# Patient Record
Sex: Male | Born: 1965 | Race: Black or African American | Hispanic: No | Marital: Married | Smoking: Current every day smoker
Health system: Southern US, Community
[De-identification: ages and names within clinical notes are randomized; demographics above are authoritative.]

## PROBLEM LIST (undated history)

## (undated) ENCOUNTER — Emergency Department (HOSPITAL_COMMUNITY): Admission: EM | Payer: BC Managed Care – PPO

## (undated) DIAGNOSIS — I10 Essential (primary) hypertension: Secondary | ICD-10-CM

## (undated) DIAGNOSIS — E7800 Pure hypercholesterolemia, unspecified: Secondary | ICD-10-CM

## (undated) DIAGNOSIS — F1410 Cocaine abuse, uncomplicated: Secondary | ICD-10-CM

## (undated) DIAGNOSIS — M109 Gout, unspecified: Secondary | ICD-10-CM

## (undated) HISTORY — PX: KNEE SURGERY: SHX244

---

## 2004-10-06 ENCOUNTER — Emergency Department (HOSPITAL_COMMUNITY): Admission: EM | Admit: 2004-10-06 | Discharge: 2004-10-06 | Payer: Self-pay | Admitting: Emergency Medicine

## 2010-01-17 ENCOUNTER — Ambulatory Visit: Payer: Self-pay | Admitting: Diagnostic Radiology

## 2010-01-17 ENCOUNTER — Emergency Department (HOSPITAL_BASED_OUTPATIENT_CLINIC_OR_DEPARTMENT_OTHER): Admission: EM | Admit: 2010-01-17 | Discharge: 2010-01-17 | Payer: Self-pay | Admitting: Emergency Medicine

## 2010-05-20 LAB — COMPREHENSIVE METABOLIC PANEL
ALT: 41 U/L (ref 0–53)
Alkaline Phosphatase: 64 U/L (ref 39–117)
Chloride: 108 mEq/L (ref 96–112)
Glucose, Bld: 93 mg/dL (ref 70–99)
Potassium: 4.3 mEq/L (ref 3.5–5.1)
Sodium: 143 mEq/L (ref 135–145)
Total Bilirubin: 0.7 mg/dL (ref 0.3–1.2)
Total Protein: 7.5 g/dL (ref 6.0–8.3)

## 2010-05-20 LAB — CBC
HCT: 44.9 % (ref 39.0–52.0)
Hemoglobin: 15.4 g/dL (ref 13.0–17.0)
RBC: 5.18 MIL/uL (ref 4.22–5.81)
RDW: 12.2 % (ref 11.5–15.5)
WBC: 7.9 10*3/uL (ref 4.0–10.5)

## 2010-05-20 LAB — URINALYSIS, ROUTINE W REFLEX MICROSCOPIC
Glucose, UA: NEGATIVE mg/dL
Hgb urine dipstick: NEGATIVE
Protein, ur: NEGATIVE mg/dL
Specific Gravity, Urine: 1.012 (ref 1.005–1.030)
pH: 7 (ref 5.0–8.0)

## 2010-05-20 LAB — DIFFERENTIAL
Basophils Relative: 0 % (ref 0–1)
Eosinophils Absolute: 0.1 10*3/uL (ref 0.0–0.7)
Monocytes Absolute: 0.7 10*3/uL (ref 0.1–1.0)
Monocytes Relative: 8 % (ref 3–12)
Neutrophils Relative %: 67 % (ref 43–77)

## 2011-02-21 ENCOUNTER — Emergency Department (INDEPENDENT_AMBULATORY_CARE_PROVIDER_SITE_OTHER)
Admission: EM | Admit: 2011-02-21 | Discharge: 2011-02-21 | Disposition: A | Discharge status: Home / Self Care | Payer: Self-pay | Source: Home / Self Care | Attending: Family Medicine | Admitting: Family Medicine

## 2011-02-21 ENCOUNTER — Encounter: Payer: Self-pay | Admitting: *Deleted

## 2011-02-21 DIAGNOSIS — N342 Other urethritis: Secondary | ICD-10-CM

## 2011-02-21 HISTORY — DX: Essential (primary) hypertension: I10

## 2011-02-21 LAB — POCT URINALYSIS DIP (DEVICE)
Hgb urine dipstick: NEGATIVE
Nitrite: NEGATIVE
Urobilinogen, UA: 1 mg/dL (ref 0.0–1.0)
pH: 7 (ref 5.0–8.0)

## 2011-02-21 MED ORDER — DOXYCYCLINE HYCLATE 100 MG PO CAPS: 100.0000 mg | ORAL_CAPSULE | Freq: Two times a day (BID) | ORAL | Status: AC

## 2011-02-21 NOTE — ED Provider Notes (Signed)
History     CSN: [id number] Arrival date & time: 02/21/2011 10:53 AM   First MD Initiated Contact with Patient 02/21/11 0932      Chief Complaint  Patient presents with  . Dysuria  . Urinary Frequency    (Consider location/radiation/quality/duration/timing/severity/associated sxs/prior treatment) Patient is a 45 y.o. male presenting with dysuria and frequency. The history is provided by the patient.  Dysuria  This is a new problem. The current episode started more than 1 week ago (onset after sexual encounter, no d/c or skin lesions.). The problem occurs every urination. The problem has not changed since onset.The quality of the pain is described as burning. The pain is mild. There has been no fever. Associated symptoms include frequency. Pertinent negatives include no urgency and no flank pain. He has tried nothing for the symptoms.  Urinary Frequency    Past Medical History  Diagnosis Date  . Hypertension     Past Surgical History  Procedure Date  . Knee surgery     Family History  Problem Relation Age of Onset  . Hypertension Mother   . Hypertension Father   . Hypertension Sister   . Hypertension Brother     History  Substance Use Topics  . Smoking status: Current Everyday Smoker  . Smokeless tobacco: Not on file  . Alcohol Use: No      Review of Systems  Constitutional: Negative.   Genitourinary: Positive for dysuria and frequency. Negative for urgency, flank pain, discharge, penile swelling, scrotal swelling, penile pain and testicular pain.    Allergies  Review of patient's allergies indicates no known allergies.  Home Medications   Current Outpatient Rx  Name Route Sig Dispense Refill  . HYDROCHLOROTHIAZIDE 25 MG PO TABS Oral Take 25 mg by mouth daily.      Marland Kitchen DOXYCYCLINE HYCLATE 100 MG PO CAPS Oral Take 1 capsule (100 mg total) by mouth 2 (two) times daily. 20 capsule 0    BP 151/86  Pulse 82  Temp(Src) 98.1 F (36.7 C) (Oral)  Resp 16   SpO2 100%  Physical Exam  Nursing note and vitals reviewed. Constitutional: He appears well-developed and well-nourished.  Abdominal: Soft. Bowel sounds are normal. Hernia confirmed negative in the right inguinal area and confirmed negative in the left inguinal area.  Genitourinary: Testes normal and penis normal. Circumcised. No penile tenderness.    ED Course  Procedures (including critical care time)   Labs Reviewed  POCT URINALYSIS DIP (DEVICE)  POCT URINALYSIS DIPSTICK  GC/CHLAMYDIA PROBE AMP, GENITAL   No results found.   1. Urethritis       MDM  U/a neg        Barkley Bruns, MD 02/21/11 1115

## 2011-02-21 NOTE — ED Notes (Signed)
Pt with c/o urinary frequency and dysuria denies penile discharge

## 2011-02-23 NOTE — ED Notes (Signed)
Pt. came in for his lab results. I verified DOB and checked chart. I  told him they were still " in process." I gave him my number and told him to call back tomorrow after 1130 and I would check again for him. Vassie Moselle 02/23/2011

## 2011-02-24 ENCOUNTER — Telehealth (HOSPITAL_COMMUNITY): Payer: Self-pay | Admitting: *Deleted

## 2011-02-26 ENCOUNTER — Telehealth (HOSPITAL_COMMUNITY): Payer: Self-pay | Admitting: *Deleted

## 2011-03-24 NOTE — ED Notes (Signed)
Brandy Sessoms from the [facility] called and requested pt.'s STD result from 12/15. I told her the GC/Chlamydia were neg. She asked me to fax them to her for the RN at the clinic. Labs faxed and confirmation received. Vassie Moselle 03/24/2011

## 2011-03-28 ENCOUNTER — Emergency Department (HOSPITAL_COMMUNITY)
Admission: EM | Admit: 2011-03-28 | Discharge: 2011-03-28 | Disposition: A | Discharge status: Home / Self Care | Payer: Self-pay | Attending: Emergency Medicine | Admitting: Emergency Medicine

## 2011-03-28 ENCOUNTER — Encounter (HOSPITAL_COMMUNITY): Payer: Self-pay | Admitting: Emergency Medicine

## 2011-03-28 DIAGNOSIS — I10 Essential (primary) hypertension: Secondary | ICD-10-CM | POA: Insufficient documentation

## 2011-03-28 DIAGNOSIS — N342 Other urethritis: Secondary | ICD-10-CM | POA: Insufficient documentation

## 2011-03-28 DIAGNOSIS — R011 Cardiac murmur, unspecified: Secondary | ICD-10-CM | POA: Insufficient documentation

## 2011-03-28 DIAGNOSIS — R350 Frequency of micturition: Secondary | ICD-10-CM | POA: Insufficient documentation

## 2011-03-28 DIAGNOSIS — R110 Nausea: Secondary | ICD-10-CM | POA: Insufficient documentation

## 2011-03-28 DIAGNOSIS — N509 Disorder of male genital organs, unspecified: Secondary | ICD-10-CM | POA: Insufficient documentation

## 2011-03-28 DIAGNOSIS — Z79899 Other long term (current) drug therapy: Secondary | ICD-10-CM | POA: Insufficient documentation

## 2011-03-28 LAB — URINALYSIS, ROUTINE W REFLEX MICROSCOPIC
Bilirubin Urine: NEGATIVE
Glucose, UA: NEGATIVE mg/dL
Ketones, ur: NEGATIVE mg/dL
pH: 6 (ref 5.0–8.0)

## 2011-03-28 LAB — URINE MICROSCOPIC-ADD ON

## 2011-03-28 MED ORDER — HYDROCHLOROTHIAZIDE 25 MG PO TABS: 25.0000 mg | ORAL_TABLET | Freq: Every day | ORAL | Status: DC

## 2011-03-28 MED ORDER — CIPROFLOXACIN HCL 500 MG PO TABS: 500.0000 mg | ORAL_TABLET | Freq: Two times a day (BID) | ORAL | Status: AC

## 2011-03-28 NOTE — ED Notes (Signed)
Feels like "something is stuck in penis-like urine is still dribbling" has been treated and tested for STDs at Urgent Care

## 2011-03-28 NOTE — ED Provider Notes (Signed)
History     CSN: [id number]  Arrival date & time 03/28/11  0820   First MD Initiated Contact with Patient 03/28/11 [phone]      Chief Complaint  Patient presents with  . Penis Pain    (Consider location/radiation/quality/duration/timing/severity/associated sxs/prior treatment) HPI  Patient relates he has a new sexual partner. He relates they were using topical chills. He states about a month ago he felt like he had a "pill in the head of my penis" and was having frequency of urination. He was seen at the urgent care and had negative STD tests. He relates he was treated with doxycycline and got better, however 2 weeks ago his symptoms returned. He states [facility] and had repeat STD tests done which were negative. He denies dysuria, he does have frequency without nocturia. He denies a drip and states sometimes he has urine leaking out after he urinates. He denies abdominal pain or fever. He has nausea without vomiting. He states he's never had this before. He states he has stopped using the topical ointments.  PCP none  Past Medical History  Diagnosis Date  . Hypertension     Past Surgical History  Procedure Date  . Knee surgery     Family History  Problem Relation Age of Onset  . Hypertension Mother   . Hypertension Father   . Hypertension Sister   . Hypertension Brother     History  Substance Use Topics  . Smoking status: Current Everyday Smoker  . Smokeless tobacco: Not on file  . 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    Allergies  Aspirin  Home Medications   Current Outpatient Rx  Name Route Sig Dispense Refill  . HYDROCHLOROTHIAZIDE 25 MG PO TABS Oral Take 25 mg by mouth daily.      Pt states he has 20 pills left  BP 147/76  Pulse 78  Resp 16  SpO2 98% Vital signs normal    Physical Exam  Nursing note  and vitals reviewed. Constitutional: He is oriented to person, place, and time. He appears well-developed and well-nourished.  Non-toxic appearance. He does not appear ill. No distress.  HENT:  Head: Normocephalic and atraumatic.  Right Ear: External ear normal.  Left Ear: External ear normal.  Nose: Nose normal. No mucosal edema or rhinorrhea.  Mouth/Throat: Mucous membranes are normal. No dental abscesses or uvula swelling.  Eyes: Conjunctivae and EOM are normal. Pupils are equal, round, and reactive to light.  Neck: Normal range of motion and full passive range of motion without pain.  Cardiovascular:  Murmur heard. Pulmonary/Chest: Effort normal. No respiratory distress. He has no rhonchi. He exhibits no crepitus.  Abdominal: Soft. Normal appearance and bowel sounds are normal. He exhibits no distension. There is no tenderness. There is no rebound and no guarding.  Genitourinary:       Patient has normal external genitalia. Testicles are normal size without masses and are nontender. His epididymis is nontender bilaterally. He is uncircumcised and he has no penile drainage during my exam. There is no abnormality to the skin on the head of his penis such as balanitis.  Musculoskeletal: Normal range of motion. He exhibits no edema and no tenderness.       Moves all extremities well.   Neurological: He is alert and oriented to person, place,  and time. He has normal strength. No cranial nerve deficit.  Skin: Skin is warm, dry and intact. No rash noted. No erythema. No pallor.  Psychiatric: He has a normal mood and affect. His speech is normal and behavior is normal. His mood appears not anxious.    ED Course  Procedures (including critical care time)  Results for orders placed during the hospital encounter of 03/28/11  URINALYSIS, ROUTINE W REFLEX MICROSCOPIC      Component Value Range   Color, Urine YELLOW  YELLOW    APPearance CLEAR  CLEAR    Specific Gravity, Urine 1.025  1.005 -  1.030    pH 6.0  5.0 - 8.0    Glucose, UA NEGATIVE  NEGATIVE (mg/dL)   Hgb urine dipstick MODERATE (*) NEGATIVE    Bilirubin Urine NEGATIVE  NEGATIVE    Ketones, ur NEGATIVE  NEGATIVE (mg/dL)   Protein, ur NEGATIVE  NEGATIVE (mg/dL)   Urobilinogen, UA 1.0  0.0 - 1.0 (mg/dL)   Nitrite NEGATIVE  NEGATIVE    Leukocytes, UA NEGATIVE  NEGATIVE   URINE MICROSCOPIC-ADD ON      Component Value Range   Squamous Epithelial / LPF RARE  RARE    RBC / HPF 0-2  <3 (RBC/hpf)   Urine-Other MUCOUS PRESENT     Laboratory interpretation all normal except microscopic hematuria    Diagnoses that have been ruled out:  None  Diagnoses that are still under consideration:  None  Final diagnoses:  Urethritis    New Prescriptions   CIPROFLOXACIN (CIPRO) 500 MG TABLET    Take 1 tablet (500 mg total) by mouth 2 (two) times daily.   HYDROCHLOROTHIAZIDE (HYDRODIURIL) 25 MG TABLET    Take 1 tablet (25 mg total) by mouth daily.   Plan discharge  Devoria Albe, MD, FACEP     MDM          Ward Givens, MD 03/28/11 1018

## 2011-06-12 DIAGNOSIS — F1990 Other psychoactive substance use, unspecified, uncomplicated: Secondary | ICD-10-CM | POA: Insufficient documentation

## 2011-06-12 DIAGNOSIS — Z720 Tobacco use: Secondary | ICD-10-CM | POA: Insufficient documentation

## 2011-06-12 DIAGNOSIS — I10 Essential (primary) hypertension: Secondary | ICD-10-CM | POA: Insufficient documentation

## 2014-11-09 ENCOUNTER — Emergency Department (HOSPITAL_BASED_OUTPATIENT_CLINIC_OR_DEPARTMENT_OTHER): Payer: 59

## 2014-11-09 ENCOUNTER — Encounter (HOSPITAL_BASED_OUTPATIENT_CLINIC_OR_DEPARTMENT_OTHER): Payer: Self-pay | Admitting: *Deleted

## 2014-11-09 ENCOUNTER — Emergency Department (HOSPITAL_BASED_OUTPATIENT_CLINIC_OR_DEPARTMENT_OTHER)
Admission: EM | Admit: 2014-11-09 | Discharge: 2014-11-09 | Disposition: A | Discharge status: Home / Self Care | Payer: 59 | Attending: Emergency Medicine | Admitting: Emergency Medicine

## 2014-11-09 DIAGNOSIS — Z720 Tobacco use: Secondary | ICD-10-CM | POA: Insufficient documentation

## 2014-11-09 DIAGNOSIS — I10 Essential (primary) hypertension: Secondary | ICD-10-CM | POA: Insufficient documentation

## 2014-11-09 DIAGNOSIS — M79662 Pain in left lower leg: Secondary | ICD-10-CM | POA: Diagnosis not present

## 2014-11-09 DIAGNOSIS — Z79899 Other long term (current) drug therapy: Secondary | ICD-10-CM | POA: Insufficient documentation

## 2014-11-09 DIAGNOSIS — R079 Chest pain, unspecified: Secondary | ICD-10-CM | POA: Insufficient documentation

## 2014-11-09 DIAGNOSIS — M79605 Pain in left leg: Secondary | ICD-10-CM

## 2014-11-09 LAB — CBC
HCT: 42.3 % (ref 39.0–52.0)
Hemoglobin: 14.1 g/dL (ref 13.0–17.0)
MCH: 28.7 pg (ref 26.0–34.0)
MCHC: 33.3 g/dL (ref 30.0–36.0)
MCV: 86.2 fL (ref 78.0–100.0)
PLATELETS: 146 10*3/uL — AB (ref 150–400)
RBC: 4.91 MIL/uL (ref 4.22–5.81)
RDW: 13.5 % (ref 11.5–15.5)
WBC: 6.7 10*3/uL (ref 4.0–10.5)

## 2014-11-09 LAB — BASIC METABOLIC PANEL
Anion gap: 7 (ref 5–15)
BUN: 13 mg/dL (ref 6–20)
CHLORIDE: 108 mmol/L (ref 101–111)
CO2: 25 mmol/L (ref 22–32)
CREATININE: 1.19 mg/dL (ref 0.61–1.24)
Calcium: 8.8 mg/dL — ABNORMAL LOW (ref 8.9–10.3)
GFR calc Af Amer: >60 mL/min (ref >60)
GLUCOSE: 89 mg/dL (ref 65–99)
POTASSIUM: 4 mmol/L (ref 3.5–5.1)
SODIUM: 140 mmol/L (ref 135–145)

## 2014-11-09 LAB — TROPONIN I: Troponin I: <0.03 ng/mL (ref <0.031)

## 2014-11-09 MED ORDER — GI COCKTAIL ~~LOC~~
30.0000 mL | Freq: Once | ORAL | Status: AC
  Administered 2014-11-09: 30 mL via ORAL
  Filled 2014-11-09: qty 30

## 2014-11-09 MED ORDER — IBUPROFEN 800 MG PO TABS
800.0000 mg | ORAL_TABLET | Freq: Once | ORAL | Status: AC
  Administered 2014-11-09: 800 mg via ORAL
  Filled 2014-11-09: qty 1

## 2014-11-09 MED ORDER — IBUPROFEN 800 MG PO TABS: 800.0000 mg | ORAL_TABLET | Freq: Three times a day (TID) | ORAL | Status: DC | PRN

## 2014-11-09 NOTE — ED Notes (Signed)
Per pt report was working in [employer] at [employer] having lt calf pain for two weeks. Tried ice, heat and ibuprofen with no change. Used ace wrap to support lt calf. Went to urgent care and was told to come to ED since he was having calf pain and chest pain. Came by EMS, report was received from paramedic.

## 2014-11-09 NOTE — ED Provider Notes (Signed)
CSN: [id number]     Arrival date & time 11/09/14  1849 History  This chart was scribe for Elwin Mocha, MD by Angelene Giovanni, ED Scribe. The patient was seen in room MH09/MH09 and the patient's care was started at 7:13 PM.    Chief Complaint  Patient presents with  . Chest Pain  . Leg Pain   Patient is a 49 y.o. male presenting with chest pain and leg pain. The history is provided by the patient. No language interpreter was used.  Chest Pain Pain location:  Unable to specify Pain radiates to:  Does not radiate Pain radiates to the back: no   Pain severity:  Moderate Onset quality:  Sudden Timing:  Constant Progression:  Unchanged Chronicity:  New Relieved by:  None tried Worsened by:  Nothing tried Ineffective treatments:  None tried Associated symptoms: no cough, no dizziness, no fever, no nausea and not vomiting   Risk factors: smoking   Leg Pain Location:  Leg Time since incident:  2 weeks Injury: no   Leg location:  L leg and L lower leg Pain details:    Radiates to:  Does not radiate   Severity:  Moderate   Onset quality:  Gradual   Duration:  2 weeks   Timing:  Intermittent   Progression:  Worsening Chronicity:  New Foreign body present:  No foreign bodies Relieved by:  Nothing Worsened by:  Extension, activity and bearing weight Associated symptoms: no fever    HPI Comments: BLUE WINTHER is a 49 y.o. male who presents to the Emergency Department complaining of moderate gradually worsening left calf pain after carrying windows up the stairs 2 weeks ago. He states that when he gets up in the am, it hurts when he walks on and gradually worse the more he stands up. He reports that his mother died from a DVT and was sent here to see if the calf pain is due to a DVT. He reports tobacco and cigarette use.   He also reports gradually worsening sudden CP onset today. He states that the CP feels like his GERD because he went to urgent care and was laid down on his back  flat. He denies any fever, N/V/D. He is also worried about a PE due to his family hx.   Past Medical History  Diagnosis Date  . Hypertension    Past Surgical History  Procedure Laterality Date  . Knee surgery     Family History  Problem Relation Age of Onset  . Hypertension Mother   . Hypertension Father   . Hypertension Sister   . Hypertension Brother    Social History  Substance Use Topics  . Smoking status: Current Every Day Smoker    Types: Cigarettes  . Smokeless tobacco: None  . Alcohol Use: No    Review of Systems  Constitutional: Negative for fever.  Respiratory: Negative for cough.   Cardiovascular: Positive for chest pain.  Gastrointestinal: Negative for nausea and vomiting.  Neurological: Negative for dizziness.  All other systems reviewed and are negative.     Allergies  Aspirin  Home Medications   Prior to Admission medications   Medication Sig Start Date End Date Taking? Authorizing Provider  lisinopril (PRINIVIL,ZESTRIL) 20 MG tablet Take 20 mg by mouth daily.   Yes Historical Provider, MD  omeprazole (PRILOSEC) 40 MG capsule Take 40 mg by mouth daily.   Yes Historical Provider, MD  pravastatin (PRAVACHOL) 20 MG tablet Take 20 mg by mouth daily.  Yes Historical Provider, MD   BP 159/79 mmHg  Pulse 63  Temp(Src) 98.3 F (36.8 C) (Oral)  Resp 18  Ht 5\' 11"  (1.803 m)  Wt 242 lb (109.77 kg)  BMI 33.77 kg/m2  SpO2 100% Physical Exam  Constitutional: He is oriented to person, place, and time. He appears well-developed and well-nourished. No distress.  HENT:  Head: Normocephalic and atraumatic.  Mouth/Throat: No oropharyngeal exudate.  Eyes: EOM are normal. Pupils are equal, round, and reactive to light.  Neck: Normal range of motion. Neck supple.  Cardiovascular: Normal rate and regular rhythm.  Exam reveals no friction rub.   No murmur heard. Pulmonary/Chest: Effort normal and breath sounds normal. No respiratory distress. He has no wheezes.  He has no rales.  Abdominal: He exhibits no distension. There is no tenderness. There is no rebound.  Musculoskeletal: Normal range of motion. He exhibits no edema.  Neurological: He is alert and oriented to person, place, and time.  Skin: He is not diaphoretic.  Nursing note and vitals reviewed.   ED Course  Procedures (including critical care time) DIAGNOSTIC STUDIES: Oxygen Saturation is 100% on air, normal by my interpretation.    COORDINATION OF CARE: 7:18 PM- Pt advised of plan for treatment and pt agrees.    Labs Review Labs Reviewed  BASIC METABOLIC PANEL  CBC  TROPONIN I    Imaging Review Dg Chest 2 View  11/09/2014   CLINICAL DATA:  Chest pain for 2 weeks.  EXAM: CHEST  2 VIEW  COMPARISON:  01/17/2010 and 10/06/2004  FINDINGS: The cardiomediastinal silhouette is unremarkable.  COPD/emphysema identified.  There is no evidence of focal airspace disease, pulmonary edema, suspicious pulmonary nodule/mass, pleural effusion, or pneumothorax. No acute bony abnormalities are identified.  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   Electronically Signed   By: Harmon Pier M.D.   On: 11/09/2014 19:45   US Venous Img Lower Unilateral Left  11/09/2014   CLINICAL DATA:  48 year old male with left lower extremity pain for 2 weeks.  EXAM: LEFT LOWER EXTREMITY VENOUS DOPPLER ULTRASOUND  TECHNIQUE: Gray-scale sonography with graded compression, as well as color Doppler and duplex ultrasound were performed to evaluate the lower extremity deep venous systems from the level of the common femoral vein and including the common femoral, femoral, profunda femoral, popliteal and calf veins including the posterior tibial, peroneal and gastrocnemius veins when visible. The superficial great saphenous vein was also interrogated. Spectral Doppler was utilized to evaluate flow at rest and with distal augmentation maneuvers in the common femoral, femoral and popliteal veins.   COMPARISON:  None.  FINDINGS: Deep venous system appears patent and compressible from groin through popliteal fossae.  Spontaneous venous flow present with evidence of respiratory phasicity. Augmentation intact.  No intraluminal thrombus identified.  Visualized portions of the greater saphenous veins patent.  The right common femoral vein is patent without DVT.  IMPRESSION: No evidence of left lower extremity deep venous thrombosis.   Electronically Signed   By: Harmon Pier M.D.   On: 11/09/2014 21:35   I have personally reviewed and evaluated these images and lab results as part of my medical decision-making.   EKG Interpretation   Date/Time:  Friday November 09 2014 18:55:58 EDT Ventricular Rate:  65 PR Interval:  134 QRS Duration: 90 QT Interval:  410 QTC Calculation: 426 R Axis:   -44 Text Interpretation:  Sinus rhythm with occasional Premature ventricular  complexes Left axis deviation Minimal voltage criteria for LVH,  may be  normal variant Abnormal ECG No prior for comparison Confirmed by [facility]   MD, Leiland Mihelich ([id number]) on 11/09/2014 6:57:11 PM      MDM   Final diagnoses:  Chest pain, unspecified chest pain type  Pain of left lower extremity    49 year old male here with left calf pain and chest pain. Sent from urgent care for concerns of blood clot in his leg. I think his leg pain is likely musculoskeletal. He felt it 2 weeks ago and states it's worse in the morning and then again at the end of the day and is worse after being on his feet all day. He has no swelling. He does have family history of blood clots. They were concerned also about him having chest pain. He said it started when a wait him back at the urgent care to get an EKG. He improved when he sat back up. He feels that his prior reflux. Will give him something for reflux here. We'll check labs. EKG is normal. We'll ultrasound his leg. No edema, no increase in size when compared to the other side.  [place] negative. Labs ok.  Stable for discharge.  I personally performed the services described in this documentation, which was scribed in my presence. The recorded information has been reviewed and is accurate.     Elwin Mocha, MD 11/09/14 [phone]

## 2014-11-09 NOTE — Discharge Instructions (Signed)
Chest Pain (Nonspecific) °It is often hard to give a specific diagnosis for the cause of chest pain. There is always a chance that your pain could be related to something serious, such as a heart attack or a blood clot in the lungs. You need to follow up with your health care provider for further evaluation. °CAUSES  °· Heartburn. °· Pneumonia or bronchitis. °· Anxiety or stress. °· Inflammation around your heart (pericarditis) or lung (pleuritis or pleurisy). °· A blood clot in the lung. °· A collapsed lung (pneumothorax). It can develop suddenly on its own (spontaneous pneumothorax) or from trauma to the chest. °· Shingles infection (herpes zoster virus). °The chest wall is composed of bones, muscles, and cartilage. Any of these can be the source of the pain. °· The bones can be bruised by injury. °· The muscles or cartilage can be strained by coughing or overwork. °· The cartilage can be affected by inflammation and become sore (costochondritis). °DIAGNOSIS  °Lab tests or other studies may be needed to find the cause of your pain. Your health care provider may have you take a test called an ambulatory electrocardiogram (ECG). An ECG records your heartbeat patterns over a 24-hour period. You may also have other tests, such as: °· Transthoracic echocardiogram (TTE). During echocardiography, sound waves are used to evaluate how blood flows through your heart. °· Transesophageal echocardiogram (TEE). °· Cardiac monitoring. This allows your health care provider to monitor your heart rate and rhythm in real time. °· Holter monitor. This is a portable device that records your heartbeat and can help diagnose heart arrhythmias. It allows your health care provider to track your heart activity for several days, if needed. °· Stress tests by exercise or by giving medicine that makes the heart beat faster. °TREATMENT  °· Treatment depends on what may be causing your chest pain. Treatment may include: °¨ Acid blockers for  heartburn. °¨ Anti-inflammatory medicine. °¨ Pain medicine for inflammatory conditions. °¨ Antibiotics if an infection is present. °· You may be advised to change lifestyle habits. This includes stopping smoking and avoiding alcohol, caffeine, and chocolate. °· You may be advised to keep your head raised (elevated) when sleeping. This reduces the chance of acid going backward from your stomach into your esophagus. °Most of the time, nonspecific chest pain will improve within 2-3 days with rest and mild pain medicine.  °HOME CARE INSTRUCTIONS  °· If antibiotics were prescribed, take them as directed. Finish them even if you start to feel better. °· For the next few days, avoid physical activities that bring on chest pain. Continue physical activities as directed. °· Do not use any tobacco products, including cigarettes, chewing tobacco, or electronic cigarettes. °· Avoid drinking alcohol. °· Only take medicine as directed by your health care provider. °· Follow your health care provider's suggestions for further testing if your chest pain does not go away. °· Keep any follow-up appointments you made. If you do not go to an appointment, you could develop lasting (chronic) problems with pain. If there is any problem keeping an appointment, call to reschedule. °SEEK MEDICAL CARE IF:  °· Your chest pain does not go away, even after treatment. °· You have a rash with blisters on your chest. °· You have a fever. °SEEK IMMEDIATE MEDICAL CARE IF:  °· You have increased chest pain or pain that spreads to your arm, neck, jaw, back, or abdomen. °· You have shortness of breath. °· You have an increasing cough, or you cough   up blood. °· You have severe back or abdominal pain. °· You feel nauseous or vomit. °· You have severe weakness. °· You faint. °· You have chills. °This is an emergency. Do not wait to see if the pain will go away. Get medical help at once. Call your local emergency services (911 in [place] Do not drive  yourself to the hospital. °MAKE SURE YOU:  °· Understand these instructions. °· Will watch your condition. °· Will get help right away if you are not doing well or get worse. °Document Released: 12/03/2004 Document Revised: 02/28/2013 Document Reviewed: 09/29/2007 °[employer]® Patient Information ©2015 [employer]. This information is not intended to replace advice given to you by your health care provider. Make sure you discuss any questions you have with your health care provider. °Musculoskeletal Pain °Musculoskeletal pain is muscle and boney aches and pains. These pains can occur in any part of the body. Your caregiver may treat you without knowing the cause of the pain. They may treat you if blood or urine tests, X-rays, and other tests were normal.  °CAUSES °There is often not a definite cause or reason for these pains. These pains may be caused by a type of germ (virus). The discomfort may also come from overuse. Overuse includes working out too hard when your body is not fit. Boney aches also come from weather changes. Bone is sensitive to atmospheric pressure changes. °HOME CARE INSTRUCTIONS  °· Ask when your test results will be ready. Make sure you get your test results. °· Only take over-the-counter or prescription medicines for pain, discomfort, or fever as directed by your caregiver. If you were given medications for your condition, do not drive, operate machinery or power tools, or sign legal documents for 24 hours. Do not drink alcohol. Do not take sleeping pills or other medications that may interfere with treatment. °· Continue all activities unless the activities cause more pain. When the pain lessens, slowly resume normal activities. Gradually increase the intensity and duration of the activities or exercise. °· During periods of severe pain, bed rest may be helpful. Lay or sit in any position that is comfortable. °· Putting ice on the injured area. °¨ Put ice in a bag. °¨ Place a towel between  your skin and the bag. °¨ Leave the ice on for 15 to 20 minutes, 3 to 4 times a day. °· Follow up with your caregiver for continued problems and no reason can be found for the pain. If the pain becomes worse or does not go away, it may be necessary to repeat tests or do additional testing. Your caregiver may need to look further for a possible cause. °SEEK IMMEDIATE MEDICAL CARE IF: °· You have pain that is getting worse and is not relieved by medications. °· You develop chest pain that is associated with shortness or breath, sweating, feeling sick to your stomach (nauseous), or throw up (vomit). °· Your pain becomes localized to the abdomen. °· You develop any new symptoms that seem different or that concern you. °MAKE SURE YOU:  °· Understand these instructions. °· Will watch your condition. °· Will get help right away if you are not doing well or get worse. °Document Released: 02/23/2005 Document Revised: 05/18/2011 Document Reviewed: 10/28/2012 °ExitCare® Patient Information ©2015 [employer]. This information is not intended to replace advice given to you by your health care provider. Make sure you discuss any questions you have with your health care provider. ° °

## 2015-07-05 DIAGNOSIS — R7303 Prediabetes: Secondary | ICD-10-CM | POA: Insufficient documentation

## 2015-10-09 ENCOUNTER — Encounter (HOSPITAL_BASED_OUTPATIENT_CLINIC_OR_DEPARTMENT_OTHER): Payer: Self-pay

## 2015-10-09 ENCOUNTER — Emergency Department (HOSPITAL_BASED_OUTPATIENT_CLINIC_OR_DEPARTMENT_OTHER): Payer: 59

## 2015-10-09 ENCOUNTER — Emergency Department (HOSPITAL_BASED_OUTPATIENT_CLINIC_OR_DEPARTMENT_OTHER)
Admission: EM | Admit: 2015-10-09 | Discharge: 2015-10-09 | Disposition: A | Discharge status: Home / Self Care | Payer: 59 | Attending: Emergency Medicine | Admitting: Emergency Medicine

## 2015-10-09 DIAGNOSIS — R0602 Shortness of breath: Secondary | ICD-10-CM | POA: Diagnosis not present

## 2015-10-09 DIAGNOSIS — F17210 Nicotine dependence, cigarettes, uncomplicated: Secondary | ICD-10-CM | POA: Diagnosis not present

## 2015-10-09 DIAGNOSIS — R51 Headache: Secondary | ICD-10-CM | POA: Diagnosis not present

## 2015-10-09 DIAGNOSIS — R002 Palpitations: Secondary | ICD-10-CM | POA: Insufficient documentation

## 2015-10-09 DIAGNOSIS — Z79899 Other long term (current) drug therapy: Secondary | ICD-10-CM | POA: Diagnosis not present

## 2015-10-09 DIAGNOSIS — R0789 Other chest pain: Secondary | ICD-10-CM | POA: Diagnosis present

## 2015-10-09 DIAGNOSIS — I10 Essential (primary) hypertension: Secondary | ICD-10-CM | POA: Insufficient documentation

## 2015-10-09 HISTORY — DX: Pure hypercholesterolemia, unspecified: E78.00

## 2015-10-09 LAB — COMPREHENSIVE METABOLIC PANEL
ALT: 36 U/L (ref 17–63)
AST: 35 U/L (ref 15–41)
Albumin: 4.2 g/dL (ref 3.5–5.0)
Alkaline Phosphatase: 54 U/L (ref 38–126)
Anion gap: 8 (ref 5–15)
BUN: 15 mg/dL (ref 6–20)
CHLORIDE: 101 mmol/L (ref 101–111)
CO2: 28 mmol/L (ref 22–32)
Calcium: 9.4 mg/dL (ref 8.9–10.3)
Creatinine, Ser: 1.32 mg/dL — ABNORMAL HIGH (ref 0.61–1.24)
GFR calc Af Amer: >60 mL/min (ref >60)
GFR calc non Af Amer: >60 mL/min (ref >60)
GLUCOSE: 101 mg/dL — AB (ref 65–99)
POTASSIUM: 3.7 mmol/L (ref 3.5–5.1)
SODIUM: 137 mmol/L (ref 135–145)
Total Bilirubin: 0.5 mg/dL (ref 0.3–1.2)
Total Protein: 7.1 g/dL (ref 6.5–8.1)

## 2015-10-09 LAB — CBC WITH DIFFERENTIAL/PLATELET
Basophils Absolute: 0 10*3/uL (ref 0.0–0.1)
Basophils Relative: 0 %
EOS PCT: 2 %
Eosinophils Absolute: 0.2 10*3/uL (ref 0.0–0.7)
HCT: 41.6 % (ref 39.0–52.0)
Hemoglobin: 14.2 g/dL (ref 13.0–17.0)
LYMPHS ABS: 2.9 10*3/uL (ref 0.7–4.0)
LYMPHS PCT: 40 %
MCH: 29.7 pg (ref 26.0–34.0)
MCHC: 34.1 g/dL (ref 30.0–36.0)
MCV: 87 fL (ref 78.0–100.0)
MONO ABS: 0.8 10*3/uL (ref 0.1–1.0)
Monocytes Relative: 11 %
Neutro Abs: 3.4 10*3/uL (ref 1.7–7.7)
Neutrophils Relative %: 47 %
PLATELETS: 166 10*3/uL (ref 150–400)
RBC: 4.78 MIL/uL (ref 4.22–5.81)
RDW: 12.5 % (ref 11.5–15.5)
WBC: 7.2 10*3/uL (ref 4.0–10.5)

## 2015-10-09 LAB — TROPONIN I: Troponin I: <0.03 ng/mL (ref <0.03)

## 2015-10-09 NOTE — Discharge Instructions (Signed)

## 2015-10-09 NOTE — ED Notes (Signed)
EDP at [facility], c/o L CP (sharp), also sob and weakness. Onset today. Mentions yesterday heart was beating fast and hard, last for several minutes. (denies: nvd, fever, dizziness, or syncope). NSR with bigeminy on monitor, HR 65. LS CTA. Rates pain 5/10. Wife at [facility]. Pt alert, NAD, calm, interactive, resps e/u, speaking in clear complete sentences, skin W&D.

## 2015-10-09 NOTE — ED Provider Notes (Signed)
[facility] Provider Note   CSN: [id number] Arrival date & time: 10/09/15  2045  First Provider Contact:  First MD Initiated Contact with Patient 10/09/15 2127     By signing my name below, I, Freida Busman, attest that this documentation has been prepared under the direction and in the presence of Zadie Rhine, MD . Electronically Signed: Freida Busman, Scribe. 10/09/2015. 9:44 PM.  History   Chief Complaint Chief Complaint  Patient presents with  . Chest Pain   The history is provided by the patient. No language interpreter was used.  Chest Pain   The quality of the pain is described as sharp. The pain does not radiate. Associated symptoms include headaches, palpitations and shortness of breath. Pertinent negatives include no abdominal pain, no cough, no diaphoresis and no vomiting. Treatments tried: ASA. The treatment provided mild relief. Risk factors include male gender and smoking/tobacco exposure.  His past medical history is significant for hyperlipidemia and hypertension.  His family medical history is significant for PE.   HPI Comments:  Jason Black is a 50 y.o. male with a history of HTN and HLD who presents to the Emergency Department complaining of sharp CP which began yesterday while he was shaving. He reports associated palpitations and SOB. Pt is a smoker. He states his pain was exacerbated while smoking. He also notes HA x 1 week. He denies LOC, cough, abdominal pain, diaphoresis, and vomiting. He also denies h/o MI CVA and blood clots. Pt admits to daily caffeine intake ~ 2 cups of coffee or tea a day. He denies alcohol consumption and illicit drug use. He has taken 81 mg ASA PTA with mild relief.   Past Medical History:  Diagnosis Date  . High cholesterol   . Hypertension     There are no active problems to display for this patient.   Past Surgical History:  Procedure Laterality Date  . KNEE SURGERY      Home Medications    Prior to  Admission medications   Medication Sig Start Date End Date Taking? Authorizing Provider  amLODipine (NORVASC) 10 MG tablet Take 10 mg by mouth daily.   Yes Historical Provider, MD  hydrochlorothiazide (HYDRODIURIL) 12.5 MG tablet Take 25 mg by mouth daily.   Yes Historical Provider, MD  multivitamin-iron-minerals-folic acid (CENTRUM) chewable tablet Chew 1 tablet by mouth daily.   Yes Historical Provider, MD  ibuprofen (ADVIL,MOTRIN) 800 MG tablet Take 1 tablet (800 mg total) by mouth every 8 (eight) hours as needed. 11/09/14   Elwin Mocha, MD  lisinopril (PRINIVIL,ZESTRIL) 20 MG tablet Take 40 mg by mouth daily.     Historical Provider, MD  omeprazole (PRILOSEC) 40 MG capsule Take 40 mg by mouth daily.    Historical Provider, MD  pravastatin (PRAVACHOL) 20 MG tablet Take 20 mg by mouth daily.    Historical Provider, MD    Family History Family History  Problem Relation Age of Onset  . Hypertension Mother   . Hypertension Father   . Hypertension Sister   . Hypertension Brother     Social History Social History  Substance Use Topics  . Smoking status: Current Every Day Smoker    Types: Cigarettes  . Smokeless tobacco: Never Used  . Alcohol use No    Allergies   Review of patient's allergies indicates no active allergies.   Review of Systems Review of Systems  Constitutional: Negative for diaphoresis.  Respiratory: Positive for shortness of breath. Negative for cough.   Cardiovascular:  Positive for chest pain and palpitations.  Gastrointestinal: Negative for abdominal pain and vomiting.  Neurological: Positive for headaches. Negative for syncope.  All other systems reviewed and are negative.  Physical Exam Updated Vital Signs BP 130/61   Pulse 64   Temp 98.4 F (36.9 C) (Oral)   Resp 13   Ht 5\' 11"  (1.803 m)   Wt 232 lb (105.2 kg)   SpO2 100%   BMI 32.36 kg/m   Physical Exam CONSTITUTIONAL: Well developed/well nourished HEAD: Normocephalic/atraumatic EYES:  EOMI/PERRL ENMT: Mucous membranes moist NECK: supple no meningeal signs SPINE/BACK:entire spine nontender CV: S1/S2 noted, no murmurs/rubs/gallops noted LUNGS: Lungs are clear to auscultation bilaterally, no apparent distress ABDOMEN: soft, nontender, no rebound or guarding, bowel sounds noted throughout abdomen GU:no cva tenderness NEURO: Pt is awake/alert/appropriate, moves all extremitiesx4.  No facial droop.   EXTREMITIES: pulses normal/equal, full ROM; no calf tenderness or edema noted SKIN: warm, color normal PSYCH: no abnormalities of mood noted, alert and oriented to situation  ED Treatments / Results  DIAGNOSTIC STUDIES:  Oxygen Saturation is 100% on RA, normal by my interpretation.    COORDINATION OF CARE:  9:29 PM Discussed treatment plan with pt at bedside and pt agreed to plan.  Labs (all labs ordered are listed, but only abnormal results are displayed) Labs Reviewed  COMPREHENSIVE METABOLIC PANEL - Abnormal; Notable for the following:       Result Value   Glucose, Bld 101 (*)    Creatinine, Ser 1.32 (*)    All other components within normal limits  CBC WITH DIFFERENTIAL/PLATELET  TROPONIN I    EKG  EKG Interpretation  Date/Time:  Wednesday October 09 2015 20:56:02 EDT Ventricular Rate:  76 PR Interval:  134 QRS Duration: 104 QT Interval:  400 QTC Calculation: 450 R Axis:   -36 Text Interpretation:  Sinus rhythm with frequent Premature ventricular complexes Possible Left atrial enlargement Left axis deviation Incomplete right bundle branch block Septal infarct , age undetermined Abnormal ECG Confirmed by Bebe Shaggy  MD, Tonnie Stillman ([id number]) on 10/09/2015 9:19:00 PM       Radiology Dg Chest 2 View  Result Date: 10/09/2015 CLINICAL DATA:  Left-sided chest pain. EXAM: CHEST  2 VIEW COMPARISON:  11/09/2014 FINDINGS: Chronic bronchial thickening and emphysema. The cardiomediastinal contours are normal. The lungs are clear. Pulmonary vasculature is normal. No  consolidation, pleural effusion, or pneumothorax. There is scoliosis of the thoracolumbar spine. IMPRESSION: Chronic bronchial thickening and emphysema.  No acute abnormality. Electronically Signed   By: Rubye Oaks M.D.   On: 10/09/2015 21:48    Procedures Procedures   Medications Ordered in ED Medications - No data to display   Initial Impression / Assessment and Plan / ED Course  I have reviewed the triage vital signs and the nursing notes.  Pertinent labs & imaging results that were available during my care of the patient were reviewed by me and considered in my medical decision making (see chart for details).  Clinical Course    Pt well appearing Low suspicion for ACS given he has sharp CP.  He appears PERC negative I doubt dissection His main concern was palpitations, wife reports his "heart was thumping" but no syncope He does have PVCs but no other dysrhythmia  I feel he is appropriate for d/c home  Final Clinical Impressions(s) / ED Diagnoses   Final diagnoses:  Other chest pain  Palpitations    New Prescriptions Discharge Medication List as of 10/09/2015 10:54 PM  I personally performed the services described in this documentation, which was scribed in my presence. The recorded information has been reviewed and is accurate.        Zadie Rhine, MD 10/09/15 [phone]

## 2015-10-09 NOTE — ED Notes (Signed)
Pt to xray, alert, NAD, calm, interactive, no dyspnea noted.

## 2015-10-09 NOTE — ED Triage Notes (Signed)
CP started last night-NAD-steady gait

## 2015-10-22 DIAGNOSIS — E7800 Pure hypercholesterolemia, unspecified: Secondary | ICD-10-CM | POA: Insufficient documentation

## 2016-03-17 ENCOUNTER — Emergency Department (HOSPITAL_BASED_OUTPATIENT_CLINIC_OR_DEPARTMENT_OTHER)
Admission: EM | Admit: 2016-03-17 | Discharge: 2016-03-17 | Disposition: A | Discharge status: Home / Self Care | Payer: 59 | Attending: Emergency Medicine | Admitting: Emergency Medicine

## 2016-03-17 ENCOUNTER — Encounter (HOSPITAL_BASED_OUTPATIENT_CLINIC_OR_DEPARTMENT_OTHER): Payer: Self-pay | Admitting: *Deleted

## 2016-03-17 ENCOUNTER — Emergency Department (HOSPITAL_BASED_OUTPATIENT_CLINIC_OR_DEPARTMENT_OTHER): Payer: 59

## 2016-03-17 DIAGNOSIS — R0602 Shortness of breath: Secondary | ICD-10-CM | POA: Insufficient documentation

## 2016-03-17 DIAGNOSIS — F1910 Other psychoactive substance abuse, uncomplicated: Secondary | ICD-10-CM

## 2016-03-17 DIAGNOSIS — F17210 Nicotine dependence, cigarettes, uncomplicated: Secondary | ICD-10-CM | POA: Insufficient documentation

## 2016-03-17 DIAGNOSIS — Z79899 Other long term (current) drug therapy: Secondary | ICD-10-CM | POA: Insufficient documentation

## 2016-03-17 DIAGNOSIS — R0789 Other chest pain: Secondary | ICD-10-CM

## 2016-03-17 DIAGNOSIS — I10 Essential (primary) hypertension: Secondary | ICD-10-CM | POA: Insufficient documentation

## 2016-03-17 HISTORY — DX: Cocaine abuse, uncomplicated: F14.10

## 2016-03-17 LAB — CBC
HEMATOCRIT: 42.5 % (ref 39.0–52.0)
Hemoglobin: 14.1 g/dL (ref 13.0–17.0)
MCH: 28.7 pg (ref 26.0–34.0)
MCHC: 33.2 g/dL (ref 30.0–36.0)
MCV: 86.4 fL (ref 78.0–100.0)
PLATELETS: 191 10*3/uL (ref 150–400)
RBC: 4.92 MIL/uL (ref 4.22–5.81)
RDW: 13 % (ref 11.5–15.5)
WBC: 8 10*3/uL (ref 4.0–10.5)

## 2016-03-17 LAB — BASIC METABOLIC PANEL
Anion gap: 8 (ref 5–15)
BUN: 12 mg/dL (ref 6–20)
CHLORIDE: 102 mmol/L (ref 101–111)
CO2: 27 mmol/L (ref 22–32)
CREATININE: 1.47 mg/dL — AB (ref 0.61–1.24)
Calcium: 9.2 mg/dL (ref 8.9–10.3)
GFR calc Af Amer: >60 mL/min (ref >60)
GFR calc non Af Amer: 54 mL/min — ABNORMAL LOW (ref >60)
GLUCOSE: 134 mg/dL — AB (ref 65–99)
Potassium: 3.3 mmol/L — ABNORMAL LOW (ref 3.5–5.1)
SODIUM: 137 mmol/L (ref 135–145)

## 2016-03-17 LAB — TROPONIN I
Troponin I: <0.03 ng/mL (ref <0.03)
Troponin I: <0.03 ng/mL (ref <0.03)

## 2016-03-17 MED ORDER — LORAZEPAM 2 MG/ML IJ SOLN
2.0000 mg | Freq: Once | INTRAMUSCULAR | Status: AC
  Administered 2016-03-17: 2 mg via INTRAVENOUS
  Filled 2016-03-17: qty 1

## 2016-03-17 MED ORDER — ASPIRIN 81 MG PO CHEW
324.0000 mg | CHEWABLE_TABLET | Freq: Once | ORAL | Status: AC
  Administered 2016-03-17: 243 mg via ORAL
  Filled 2016-03-17: qty 4

## 2016-03-17 MED ORDER — POTASSIUM CHLORIDE CRYS ER 20 MEQ PO TBCR
40.0000 meq | EXTENDED_RELEASE_TABLET | Freq: Once | ORAL | Status: AC
  Administered 2016-03-17: 40 meq via ORAL
  Filled 2016-03-17: qty 2

## 2016-03-17 NOTE — ED Notes (Signed)
Patient transported to X-ray 

## 2016-03-17 NOTE — ED Notes (Signed)
ED Provider at bedside. 

## 2016-03-17 NOTE — ED Provider Notes (Signed)
[facility]   CSN: [id number] Arrival date & time: 03/17/16  1903  By signing my name below, I, Linna DarnerRussell Turner, attest that this documentation has been prepared under the direction and in the presence of physician practitioner, Melene Planan Ephrem Carrick, DO. Electronically Signed: Linna Darnerussell Turner, Scribe. 03/17/2016. 7:20 PM.  History   Chief Complaint Chief Complaint  Patient presents with  . Chest Pain    The history is provided by the patient. No language interpreter was used.  Chest Pain   This is a new problem. The current episode started 1 to 2 hours ago. The problem occurs constantly. The problem has not changed since onset.Associated with: drug use. The pain is present in the lateral region. The pain is moderate. The pain radiates to the left jaw. Exacerbated by: applied pressure. Associated symptoms include cough and shortness of breath (mild). Pertinent negatives include no diaphoresis, no fever, no hemoptysis, no lower extremity edema, no nausea and no vomiting. He has tried nothing for the symptoms. Risk factors include male gender, substance abuse and smoking/tobacco exposure.  His past medical history is significant for hyperlipidemia and hypertension.  Pertinent negatives for past medical history include no DVT and no MI.   HPI Comments: Jason Black is a 51 y.o. male with PMHx significant for cocaine abuse, HTN, and high cholesterol who presents to the Emergency Department complaining of sudden onset, constant, non-radiating, left-sided chest pain beginning about an hour ago. Pt notes associated mild SOB. He states his CP radiates into his left jaw and is worse with applied pressure to the left side of his chest. Pt states his chest pain is better with standing up and ambulating. He states he has been "smoking dope" regularly for about 40 days and used it today immediately prior to his CP presenting. He notes he did not inhale strongly prior to presentation of his  chest pain. He notes he has been smoking cigarettes regularly for about one month and has developed congestion and a persistent cough since then. No h/o MI or blood clot. No recent surgeries, hospitalizations, or immobilizations. No h/o cancer. Pt reports he had not used "dope" for 6 years prior to the last 40 days. He is scheduled to begin drug rehabilitation tomorrow. He denies leg swelling, hemoptysis, fever, chills, nausea, vomiting, diaphoresis, or any other associated symptoms.  Past Medical History:  Diagnosis Date  . Cocaine abuse   . High cholesterol   . Hypertension     There are no active problems to display for this patient.   Past Surgical History:  Procedure Laterality Date  . KNEE SURGERY         Home Medications    Prior to Admission medications   Medication Sig Start Date End Date Taking? Authorizing Provider  amLODipine (NORVASC) 10 MG tablet Take 10 mg by mouth daily.    Historical Provider, MD  hydrochlorothiazide (HYDRODIURIL) 12.5 MG tablet Take 25 mg by mouth daily.    Historical Provider, MD  ibuprofen (ADVIL,MOTRIN) 800 MG tablet Take 1 tablet (800 mg total) by mouth every 8 (eight) hours as needed. 11/09/14   Elwin MochaBlair Walden, MD  lisinopril (PRINIVIL,ZESTRIL) 20 MG tablet Take 40 mg by mouth daily.     Historical Provider, MD  multivitamin-iron-minerals-folic acid (CENTRUM) chewable tablet Chew 1 tablet by mouth daily.    Historical Provider, MD  omeprazole (PRILOSEC) 40 MG capsule Take 40 mg by mouth daily.    Historical Provider, MD  pravastatin (PRAVACHOL) 20 MG tablet  Take 20 mg by mouth daily.    Historical Provider, MD    Family History Family History  Problem Relation Age of Onset  . Hypertension Mother   . Hypertension Father   . Hypertension Sister   . Hypertension Brother     Social History Social History  Substance Use Topics  . Smoking status: Current Every Day Smoker    Types: Cigarettes  . Smokeless tobacco: Never Used  . Alcohol use  No     Allergies   Patient has no active allergies.   Review of Systems Review of Systems  Constitutional: Negative for diaphoresis and fever.  HENT: Positive for congestion.   Respiratory: Positive for cough and shortness of breath (mild). Negative for hemoptysis.   Cardiovascular: Positive for chest pain.  Gastrointestinal: Negative for nausea and vomiting.  All other systems reviewed and are negative.    Physical Exam Updated Vital Signs BP 104/69   Pulse 78   Temp 97.5 F (36.4 C) (Oral)   Resp 20   Ht 5\' 11"  (1.803 m)   Wt 229 lb (103.9 kg)   SpO2 96%   BMI 31.94 kg/m   Physical Exam  Constitutional: He is oriented to person, place, and time. He appears well-developed and well-nourished.  HENT:  Head: Normocephalic and atraumatic.  Eyes: EOM are normal. Pupils are equal, round, and reactive to light.  Neck: Normal range of motion. Neck supple. No JVD present.  Cardiovascular: Normal rate and regular rhythm.  Exam reveals no gallop and no friction rub.   No murmur heard. Pulmonary/Chest: No respiratory distress. He has no wheezes. He exhibits tenderness.  Reproducible pain to the left lateral chest wall about ribs 5 and 6.  Abdominal: He exhibits no distension. There is no rebound and no guarding.  Musculoskeletal: Normal range of motion.  Neurological: He is alert and oriented to person, place, and time.  Skin: No rash noted. No pallor.  Psychiatric: He has a normal mood and affect. His behavior is normal.  Nursing note and vitals reviewed.    ED Treatments / Results  Labs (all labs ordered are listed, but only abnormal results are displayed) Labs Reviewed  BASIC METABOLIC PANEL - Abnormal; Notable for the following:       Result Value   Potassium 3.3 (*)    Glucose, Bld 134 (*)    Creatinine, Ser 1.47 (*)    GFR calc non Af Amer 54 (*)    All other components within normal limits  CBC  TROPONIN I  TROPONIN I    EKG  EKG  Interpretation  Date/Time:  Tuesday March 17 2016 19:08:28 EST Ventricular Rate:  105 PR Interval:  124 QRS Duration: 88 QT Interval:  358 QTC Calculation: 473 R Axis:   -38 Text Interpretation:  Sinus tachycardia with frequent Premature ventricular complexes Possible Left atrial enlargement Left axis deviation Septal infarct , age undetermined Abnormal ECG No significant change since last tracing Confirmed by Cruzito Standre MD, DANIEL [phone]) on 03/17/2016 7:12:16 PM       Radiology Dg Chest 2 View  Result Date: 03/17/2016 CLINICAL DATA:  Acute onset of left-sided chest pain and facial numbness. Initial encounter. EXAM: CHEST  2 VIEW COMPARISON:  Chest radiograph performed 10/09/2015 FINDINGS: The lungs are well-aerated. Mild peribronchial thickening is noted. Chronically increased interstitial markings are seen. There is no evidence of pleural effusion or pneumothorax. The heart is normal in size; the mediastinal contour is within normal limits. No acute osseous abnormalities are  seen. IMPRESSION: Mild peribronchial thickening noted. Chronically increased interstitial markings seen. Electronically Signed   By: Roanna Raider M.D.   On: 03/17/2016 20:27    Procedures Procedures (including critical care time)  DIAGNOSTIC STUDIES: Oxygen Saturation is 100% on RA, normal by my interpretation.    COORDINATION OF CARE: 7:30 PM Discussed treatment plan with pt at bedside and pt agreed to plan.  Medications Ordered in ED Medications  aspirin chewable tablet 324 mg (243 mg Oral Given 03/17/16 1945)  LORazepam (ATIVAN) injection 2 mg (2 mg Intravenous Given 03/17/16 1945)  potassium chloride SA (K-DUR,KLOR-CON) CR tablet 40 mEq (40 mEq Oral Given 03/17/16 2155)     Initial Impression / Assessment and Plan / ED Course  I have reviewed the triage vital signs and the nursing notes.  Pertinent labs & imaging results that were available during my care of the patient were reviewed by me and considered in  my medical decision making (see chart for details).  Clinical Course     51 yo M With a chief complaint of left-sided sharp chest pain. This is worse with palpation twisting movement. Occurred right after he had smokes an illegal drugs. Has a history of cocaine abuse. He fell off the wagon. With his increased risk of MI will obtain a delta troponin. Pain is reproducible with palpation of the chest wall. Delta troponin negative. Discharge home.  10:37 PM:  I have discussed the diagnosis/risks/treatment options with the patient and family and believe the pt to be eligible for discharge home to follow-up with PCP. We also discussed returning to the ED immediately if new or worsening sx occur. We discussed the sx which are most concerning (e.g., sudden worsening pain, fever, inability to tolerate by mouth) that necessitate immediate return. Medications administered to the patient during their visit and any new prescriptions provided to the patient are listed below.  Medications given during this visit Medications  aspirin chewable tablet 324 mg (243 mg Oral Given 03/17/16 1945)  LORazepam (ATIVAN) injection 2 mg (2 mg Intravenous Given 03/17/16 1945)  potassium chloride SA (K-DUR,KLOR-CON) CR tablet 40 mEq (40 mEq Oral Given 03/17/16 2155)     The patient appears reasonably screen and/or stabilized for discharge and I doubt any other medical condition or other [facility] requiring further screening, evaluation, or treatment in the ED at this time prior to discharge.    Final Clinical Impressions(s) / ED Diagnoses   Final diagnoses:  Drug abuse  Chest wall pain    New Prescriptions New Prescriptions   No medications on file    I personally performed the services described in this documentation, which was scribed in my presence. The recorded information has been reviewed and is accurate.    Melene Plan, DO 03/17/16 2237

## 2016-03-17 NOTE — ED Triage Notes (Signed)
Pt c/o CP  radiates to left jaw and face right after using cocaine. Denies SOB. HX substance abuse

## 2016-03-17 NOTE — Discharge Instructions (Signed)
Take 4 over the counter ibuprofen tablets 3 times a day or 2 over-the-counter naproxen tablets twice a day for pain. Also take tylenol 1000mg(2 extra strength) four times a day.    

## 2016-03-17 NOTE — ED Notes (Signed)
Patient denies pain and is resting comfortably.  

## 2016-03-17 NOTE — ED Notes (Signed)
States " I had some bad dope today and I ain't never doing that again" Per family is scheduled to go to drug rehab tomorrow

## 2016-03-17 NOTE — ED Notes (Signed)
Late entry 2030 and 2045 resp. Rate 18, instead of 0 as validated

## 2016-05-13 ENCOUNTER — Emergency Department (HOSPITAL_BASED_OUTPATIENT_CLINIC_OR_DEPARTMENT_OTHER): Payer: 59

## 2016-05-13 ENCOUNTER — Emergency Department (HOSPITAL_BASED_OUTPATIENT_CLINIC_OR_DEPARTMENT_OTHER)
Admission: EM | Admit: 2016-05-13 | Discharge: 2016-05-13 | Disposition: A | Discharge status: Home / Self Care | Payer: 59 | Attending: Emergency Medicine | Admitting: Emergency Medicine

## 2016-05-13 ENCOUNTER — Encounter (HOSPITAL_BASED_OUTPATIENT_CLINIC_OR_DEPARTMENT_OTHER): Payer: Self-pay | Admitting: Emergency Medicine

## 2016-05-13 DIAGNOSIS — Z79899 Other long term (current) drug therapy: Secondary | ICD-10-CM | POA: Insufficient documentation

## 2016-05-13 DIAGNOSIS — M10061 Idiopathic gout, right knee: Secondary | ICD-10-CM | POA: Insufficient documentation

## 2016-05-13 DIAGNOSIS — M109 Gout, unspecified: Secondary | ICD-10-CM

## 2016-05-13 DIAGNOSIS — F17210 Nicotine dependence, cigarettes, uncomplicated: Secondary | ICD-10-CM | POA: Insufficient documentation

## 2016-05-13 DIAGNOSIS — I10 Essential (primary) hypertension: Secondary | ICD-10-CM | POA: Insufficient documentation

## 2016-05-13 LAB — URIC ACID: Uric Acid, Serum: 8.2 mg/dL — ABNORMAL HIGH (ref 4.4–7.6)

## 2016-05-13 MED ORDER — PREDNISONE 20 MG PO TABS: ORAL_TABLET | ORAL | 0 refills | Status: DC

## 2016-05-13 MED ORDER — OXYCODONE-ACETAMINOPHEN 5-325 MG PO TABS: 1.0000 | ORAL_TABLET | ORAL | 0 refills | Status: DC | PRN

## 2016-05-13 NOTE — ED Triage Notes (Signed)
Started having right knee pain last night, denies any injury

## 2016-05-13 NOTE — ED Notes (Signed)
Obvioius swelling noted at Rt knee, medial and lateral aspect. Not tender to touch. Skin warm and dry.

## 2016-05-13 NOTE — Discharge Instructions (Signed)
Take the prescribed medication as directed.  Do not drive while taking the pain medication, it can make you sleepy/drowsy. One of your medications, the HCTZ, can make gout worse so you will need to discuss this with your doctor as well. Follow diet low in purines (ie-- limited red meat, seafood, alcohol ,etc).  See attached document for more information.`` Follow-up with your primary care doctor.   Return to the ED for new or worsening symptoms.

## 2016-05-13 NOTE — ED Notes (Signed)
Patient transported to X-ray 

## 2016-05-13 NOTE — ED Notes (Signed)
Presents to ED with c/o Rt knee pain and swelling, states occurred acutely, Tried resting, elevation and ice application. Some decrease in pain per pt statement. Denies any injury to rt knee.

## 2016-05-13 NOTE — ED Provider Notes (Signed)
[facility] Provider Note   CSN: [id number] Arrival date & time: 05/13/16  1311     History   Chief Complaint Chief Complaint  Patient presents with  . Knee Pain    HPI Jason Black is a 51 y.o. male.  The history is provided by the patient and medical records.  Knee Pain      51 year old male with history of hypertension, hyperlipidemia, history of cocaine abuse currently sober, presenting to the ED for right knee pain. Patient reports yesterday he was at the grocery store walking down with his cart when he had sudden onset of sharp, stabbing pain in his right knee. States it stopped him in his tracks. No twisting injury or fall.  States he finishes shopping and went to check out, and by the time he drove home he was having trouble even getting out of the car. States he has noticed progressive swelling of the knee throughout the night. States it is very painful to put weight on his right leg. He has been using crutches which she had at home to assist with ambulation which has helped somewhat. Reports he did ice and elevate the knee at home with mild improvement of the swelling.  He does have hx of prior right knee injury in the 1990's-- torn cartilage.  States he has seen [facility] in the past and was told he had osteoarthritis.  No hx of gout.  Past Medical History:  Diagnosis Date  . Cocaine abuse   . High cholesterol   . Hypertension     There are no active problems to display for this patient.   Past Surgical History:  Procedure Laterality Date  . KNEE SURGERY         Home Medications    Prior to Admission medications   Medication Sig Start Date End Date Taking? Authorizing Provider  amLODipine (NORVASC) 10 MG tablet Take 10 mg by mouth daily.    Historical Provider, MD  hydrochlorothiazide (HYDRODIURIL) 12.5 MG tablet Take 25 mg by mouth daily.    Historical Provider, MD  ibuprofen (ADVIL,MOTRIN) 800 MG tablet Take 1 tablet (800 mg  total) by mouth every 8 (eight) hours as needed. 11/09/14   Elwin MochaBlair Walden, MD  lisinopril (PRINIVIL,ZESTRIL) 20 MG tablet Take 40 mg by mouth daily.     Historical Provider, MD  multivitamin-iron-minerals-folic acid (CENTRUM) chewable tablet Chew 1 tablet by mouth daily.    Historical Provider, MD  omeprazole (PRILOSEC) 40 MG capsule Take 40 mg by mouth daily.    Historical Provider, MD  pravastatin (PRAVACHOL) 20 MG tablet Take 20 mg by mouth daily.    Historical Provider, MD    Family History Family History  Problem Relation Age of Onset  . Hypertension Mother   . Hypertension Father   . Hypertension Sister   . Hypertension Brother     Social History Social History  Substance Use Topics  . Smoking status: Current Every Day Smoker    Types: Cigarettes  . Smokeless tobacco: Never Used  . Alcohol use No     Allergies   Patient has no active allergies.   Review of Systems Review of Systems  Musculoskeletal: Positive for arthralgias and joint swelling.  All other systems reviewed and are negative.    Physical Exam Updated Vital Signs BP 131/87   Pulse 75   Temp 97.7 F (36.5 C) (Oral)   Resp 18   Ht 5\' 11"  (1.803 m)   Wt 100.2 kg  SpO2 98%   BMI 30.82 kg/m   Physical Exam  Constitutional: He is oriented to person, place, and time. He appears well-developed and well-nourished.  HENT:  Head: Normocephalic and atraumatic.  Mouth/Throat: Oropharynx is clear and moist.  Eyes: Conjunctivae and EOM are normal. Pupils are equal, round, and reactive to light.  Neck: Normal range of motion.  Cardiovascular: Normal rate, regular rhythm and normal heart sounds.   Pulmonary/Chest: Effort normal and breath sounds normal. No respiratory distress. He has no wheezes.  Abdominal: Soft. Bowel sounds are normal.  Musculoskeletal: Normal range of motion.  Right knee is swollen with obvious effusion, mild warmth to touch without overlying skin changes; no signs of cellulitis, no  open wounds or sores; there is tenderness along the medial and lateral joint lines but no bony deformity, no significant ligamentous laxity noted, flexion is somewhat limited due to pain/swelling, DP pulse intact, ambulatory but with limping gait, using crutches to assist  Neurological: He is alert and oriented to person, place, and time.  Skin: Skin is warm and dry.  Psychiatric: He has a normal mood and affect.  Nursing note and vitals reviewed.    ED Treatments / Results  Labs (all labs ordered are listed, but only abnormal results are displayed) Labs Reviewed  URIC ACID - Abnormal; Notable for the following:       Result Value   Uric Acid, Serum 8.2 (*)    All other components within normal limits    EKG  EKG Interpretation None       Radiology Dg Knee Complete 4 Views Right  Result Date: 05/13/2016 CLINICAL DATA:  RIGHT knee pain after walking yesterday. EXAM: RIGHT KNEE - COMPLETE 4+ VIEW COMPARISON:  None. FINDINGS: No acute fracture deformity or dislocation. Moderate tricompartmental osteoarthrosis with periarticular sclerosis and marginal spurring. No fracture deformity. No dislocation. Moderate suprapatellar joint effusion. No subcutaneous gas or radiopaque foreign bodies. IMPRESSION: No acute fracture deformity dislocation. Moderate suprapatellar joint effusion. Moderate tricompartmental osteoarthrosis. Electronically Signed   By: Awilda Metro M.D.   On: 05/13/2016 14:04    Procedures Procedures (including critical care time)  Medications Ordered in ED Medications - No data to display   Initial Impression / Assessment and Plan / ED Course  I have reviewed the triage vital signs and the nursing notes.  Pertinent labs & imaging results that were available during my care of the patient were reviewed by me and considered in my medical decision making (see chart for details).  51 year old male here with right knee pain. No recent injury, trauma, or falls. Was told  a few years ago he had osteoarthritis. On exam he has obvious effusion of the right knee with some swelling along the medial and lateral joint lines. There is no gross deformity. No significant ligamentous laxity. Knee is somewhat warm to touch but there is no overlying erythema or other skin changes. No open wounds or signs of infection. Do not clinically suspect septic joint.  His vital signs are stable. He has no history of gout, however have some clinical suspicion for this. Will obtain uric acid as well as x-ray.  Uric acid is elevated 8.2. X-ray without signs of fracture or dislocation, joint effusion with osteoarthritis noted. Results discussed with patient, he acknowledged understanding. Recent lab work from earlier this year revealed a creatinine of 1.47 so will try to avoid anti-inflammatories in his case. We'll treat with prednisone and pain medication. He was encouraged to follow a low purine diet  to help prevent gout flares. Patient is also on HCTZ which may be contributing to his symptoms as well. I recommended he discuss this medication with his doctor.  Final Clinical Impressions(s) / ED Diagnoses   Final diagnoses:  Acute gout of right knee, unspecified cause    New Prescriptions Discharge Medication List as of 05/13/2016  2:48 PM    START taking these medications   Details  oxyCODONE-acetaminophen (PERCOCET/ROXICET) 5-325 MG tablet Take 1 tablet by mouth every 4 (four) hours as needed., Starting Wed 05/13/2016, Print    predniSONE (DELTASONE) 20 MG tablet Take 40 mg by mouth daily for 3 days, then 20mg  by mouth daily for 3 days, then 10mg  daily for 3 days, Print         Garlon Hatchet, PA-C 05/13/16 1521    Geoffery Lyons, MD 05/14/16 1105

## 2016-07-15 ENCOUNTER — Encounter (HOSPITAL_BASED_OUTPATIENT_CLINIC_OR_DEPARTMENT_OTHER): Payer: Self-pay

## 2016-07-15 ENCOUNTER — Emergency Department (HOSPITAL_BASED_OUTPATIENT_CLINIC_OR_DEPARTMENT_OTHER): Payer: Managed Care, Other (non HMO)

## 2016-07-15 ENCOUNTER — Emergency Department (HOSPITAL_BASED_OUTPATIENT_CLINIC_OR_DEPARTMENT_OTHER)
Admission: EM | Admit: 2016-07-15 | Discharge: 2016-07-16 | Disposition: A | Discharge status: Home / Self Care | Payer: Managed Care, Other (non HMO) | Attending: Emergency Medicine | Admitting: Emergency Medicine

## 2016-07-15 DIAGNOSIS — I10 Essential (primary) hypertension: Secondary | ICD-10-CM | POA: Insufficient documentation

## 2016-07-15 DIAGNOSIS — M1711 Unilateral primary osteoarthritis, right knee: Secondary | ICD-10-CM | POA: Insufficient documentation

## 2016-07-15 DIAGNOSIS — Z79899 Other long term (current) drug therapy: Secondary | ICD-10-CM | POA: Insufficient documentation

## 2016-07-15 DIAGNOSIS — F17210 Nicotine dependence, cigarettes, uncomplicated: Secondary | ICD-10-CM | POA: Insufficient documentation

## 2016-07-15 HISTORY — DX: Gout, unspecified: M10.9

## 2016-07-15 MED ORDER — LIDOCAINE-EPINEPHRINE 2 %-1:100000 IJ SOLN
INTRAMUSCULAR | Status: AC
  Filled 2016-07-15: qty 1

## 2016-07-15 MED ORDER — HYDROMORPHONE HCL 1 MG/ML IJ SOLN
1.0000 mg | Freq: Once | INTRAMUSCULAR | Status: AC
  Administered 2016-07-15: 1 mg via INTRAVENOUS
  Filled 2016-07-15: qty 1

## 2016-07-15 MED ORDER — HYDROMORPHONE HCL 1 MG/ML IJ SOLN
1.0000 mg | Freq: Once | INTRAMUSCULAR | Status: AC
  Administered 2016-07-15: 1 mg via INTRAVENOUS

## 2016-07-15 MED ORDER — HYDROMORPHONE HCL 1 MG/ML IJ SOLN
INTRAMUSCULAR | Status: AC
  Filled 2016-07-15: qty 1

## 2016-07-15 NOTE — ED Notes (Signed)
Iv pulled out, new iv placed

## 2016-07-15 NOTE — ED Notes (Signed)
Patient transported to X-ray 

## 2016-07-15 NOTE — ED Notes (Signed)
ED Provider at bedside. 

## 2016-07-15 NOTE — ED Notes (Signed)
Verbal order for dilaudid 1mg  iv, pt rolling around on stretcher c/o severe pain

## 2016-07-15 NOTE — ED Triage Notes (Signed)
c/o pain to right knee x 30 min-hx of "gout every 2 weeks"-presents to triage in w/c, screaming

## 2016-07-15 NOTE — ED Provider Notes (Signed)
[facility]   CSN: [id number] Arrival date & time: 07/15/16  1957  By signing my name below, I, Majel Homer, attest that this documentation has been prepared under the direction and in the presence of Linwood Dibbles, MD . Electronically Signed: Majel Homer, Scribe. 07/15/2016. 8:22 PM.  History   Chief Complaint Chief Complaint  Patient presents with  . Knee Pain   The history is provided by the patient. No language interpreter was used.   HPI Comments: Jason Black is a 51 y.o. male with PMHx of gout, HTN, who presents to the Emergency Department complaining of gradually worsening, right knee pain consistent with previous exacerbations of gout that began ~30 minutes PTA. Pt reports associated right knee swelling. He denies any recent trauma or injury to his knee, fever, chills, nausea, or vomiting.   Past Medical History:  Diagnosis Date  . Cocaine abuse   . Gout   . High cholesterol   . Hypertension    There are no active problems to display for this patient.  Past Surgical History:  Procedure Laterality Date  . KNEE SURGERY      Home Medications    Prior to Admission medications   Medication Sig Start Date End Date Taking? Authorizing Provider  amLODipine (NORVASC) 10 MG tablet Take 10 mg by mouth daily.    [provider]  hydrochlorothiazide (HYDRODIURIL) 12.5 MG tablet Take 25 mg by mouth daily.    [provider]  ibuprofen (ADVIL,MOTRIN) 800 MG tablet Take 1 tablet (800 mg total) by mouth every 8 (eight) hours as needed. 11/09/14   Elwin Mocha, MD  lisinopril (PRINIVIL,ZESTRIL) 20 MG tablet Take 40 mg by mouth daily.     [provider]  multivitamin-iron-minerals-folic acid (CENTRUM) chewable tablet Chew 1 tablet by mouth daily.    [provider]  omeprazole (PRILOSEC) 40 MG capsule Take 40 mg by mouth daily.    [provider]  pravastatin (PRAVACHOL) 20 MG tablet Take 20 mg by mouth daily.     [provider]  traMADol (ULTRAM) 50 MG tablet Take 1 tablet (50 mg total) by mouth every 6 (six) hours as needed. 07/16/16   Linwood Dibbles, MD    Family History Family History  Problem Relation Age of Onset  . Hypertension Mother   . Hypertension Father   . Hypertension Sister   . Hypertension Brother     Social History Social History  Substance Use Topics  . Smoking status: Current Every Day Smoker    Types: Cigarettes  . Smokeless tobacco: Never Used  . Alcohol use No   Allergies   Patient has no active allergies.  Review of Systems Review of Systems  Constitutional: Negative for chills and fever.  Gastrointestinal: Negative for nausea and vomiting.  Musculoskeletal: Positive for arthralgias and joint swelling.   Physical Exam Updated Vital Signs BP (!) 188/99 (BP Location: Right Arm)   Pulse (!) 50   Temp 98.3 F (36.8 C) (Oral)   Resp (!) 24   Ht 5\' 10"  (1.778 m)   Wt 220 lb (99.8 kg)   SpO2 100%   BMI 31.57 kg/m   Physical Exam  Constitutional: He appears well-developed and well-nourished. No distress.  HENT:  Head: Normocephalic and atraumatic.  Right Ear: External ear normal.  Left Ear: External ear normal.  Eyes: Conjunctivae are normal. Right eye exhibits no discharge. Left eye exhibits no discharge. No scleral icterus.  Neck: Neck supple. No tracheal deviation  present.  Cardiovascular: Normal rate.   Pulmonary/Chest: Effort normal. No stridor. No respiratory distress.  Abdominal: He exhibits no distension.  Musculoskeletal: He exhibits edema and tenderness.  Neurological: He is alert. Cranial nerve deficit: no gross deficits.  Skin: Skin is warm and dry. No rash noted.  Psychiatric: He has a normal mood and affect.  Nursing note and vitals reviewed.  ED Treatments / Results  DIAGNOSTIC STUDIES:  Oxygen Saturation is 100% on RA, normal by my interpretation.    COORDINATION OF CARE:  8:20 PM Discussed treatment plan with pt at bedside  and pt agreed to plan.  Labs (all labs ordered are listed, but only abnormal results are displayed) Labs Reviewed  GRAM STAIN  BODY FLUID CULTURE  SYNOVIAL CELL COUNT + DIFF, W/ CRYSTALS    EKG  EKG Interpretation None       Radiology Dg Knee 2 Views Right  Result Date: 07/15/2016 CLINICAL DATA:  Severe RIGHT knee pain.  No injury. EXAM: RIGHT KNEE - 1-2 VIEW COMPARISON:  05/13/2016.  Multiple FINDINGS: No evidence of fracture or dislocation. Severe tricompartmental degenerative change. Large suprapatellar bursa effusion is increased from priors. IMPRESSION: Worsening effusion.  Severe DJD. Electronically Signed   By: Elsie Stain M.D.   On: 07/15/2016 21:24   Procedures .Joint Aspiration/Arthrocentesis Date/Time: 07/15/2016 11:07 PM Performed by: Linwood Dibbles Authorized by: Linwood Dibbles   Consent:    Consent obtained:  Verbal   Consent given by:  Patient   Risks discussed:  Bleeding, incomplete drainage, infection, nerve damage and pain   Alternatives discussed:  No treatment Location:    Location:  Knee   Knee:  R knee Anesthesia (see MAR for exact dosages):    Anesthesia method:  Local infiltration   Local anesthetic:  Lidocaine 1% WITH epi Procedure details:    Preparation: Patient was prepped and draped in usual sterile fashion     Needle gauge:  18 G   Ultrasound guidance: no     Approach:  Lateral   Aspirate characteristics:  Bloody   Steroid injected: no     Specimen collected: yes   Post-procedure details:    Patient tolerance of procedure:  Tolerated well, no immediate complications   (including critical care time)  Medications Ordered in ED Medications  HYDROmorphone (DILAUDID) 1 MG/ML injection (not administered)  lidocaine-EPINEPHrine (XYLOCAINE W/EPI) 2 %-1:100000 (with pres) injection (not administered)  HYDROmorphone (DILAUDID) 1 MG/ML injection (not administered)  HYDROmorphone (DILAUDID) injection 1 mg (1 mg Intravenous Given 07/15/16 2026)    HYDROmorphone (DILAUDID) injection 1 mg (1 mg Intravenous Given 07/15/16 2044)  HYDROmorphone (DILAUDID) injection 1 mg (1 mg Intravenous Given 07/15/16 2230)    Initial Impression / Assessment and Plan / ED Course  I have reviewed the triage vital signs and the nursing notes.  Pertinent labs & imaging results that were available during my care of the patient were reviewed by me and considered in my medical decision making (see chart for details).   patient's exam shows severe osteoarthritis. Arthrocentesis was performed for symptomatic relief. Patient had a fair amount of bloody joint fluid. I clearly related to the osteoarthritis. Dr. Read Drivers will follow-up on the joint fluid analysis  Final Clinical Impressions(s) / ED Diagnoses   Final diagnoses:  Primary osteoarthritis of right knee    New Prescriptions New Prescriptions   TRAMADOL (ULTRAM) 50 MG TABLET    Take 1 tablet (50 mg total) by mouth every 6 (six) hours as needed.  Linwood DibblesKnapp, Calin Fantroy, MD 07/16/16 Jorje [id number]

## 2016-07-15 NOTE — ED Notes (Signed)
Verbal order for dilaudid 1 mg iv for Jason Sicklespain,from Knapp MD

## 2016-07-16 LAB — SYNOVIAL CELL COUNT + DIFF, W/ CRYSTALS
CRYSTALS FLUID: NONE SEEN
EOSINOPHILS-SYNOVIAL: 0 % (ref 0–1)
Lymphocytes-Synovial Fld: 27 % — ABNORMAL HIGH (ref 0–20)
MONOCYTE-MACROPHAGE-SYNOVIAL FLUID: 11 % — AB (ref 50–90)
Neutrophil, Synovial: 62 % — ABNORMAL HIGH (ref 0–25)
WBC, Synovial: 1680 /mm3 — ABNORMAL HIGH (ref 0–200)

## 2016-07-16 MED ORDER — ONDANSETRON HCL 4 MG/2ML IJ SOLN
4.0000 mg | Freq: Once | INTRAMUSCULAR | Status: AC
Start: 2016-07-16 — End: 2016-07-16
  Administered 2016-07-16: 4 mg via INTRAVENOUS

## 2016-07-16 MED ORDER — TRAMADOL HCL 50 MG PO TABS: 50.0000 mg | ORAL_TABLET | Freq: Four times a day (QID) | ORAL | 0 refills | Status: DC | PRN

## 2016-07-16 MED ORDER — ONDANSETRON HCL 4 MG/2ML IJ SOLN
INTRAMUSCULAR | Status: AC
  Filled 2016-07-16: qty 2

## 2016-07-16 MED ORDER — ONDANSETRON HCL 4 MG/2ML IJ SOLN
4.0000 mg | Freq: Once | INTRAMUSCULAR | Status: DC
Start: 2016-07-16 — End: 2016-07-16

## 2016-07-16 NOTE — ED Provider Notes (Signed)
Nursing notes and vitals signs, including pulse oximetry, reviewed.  Summary of this visit's results, reviewed by myself:  EKG:  EKG Interpretation  Date/Time:  Thursday Jul 16 2016 01:26:49 EDT Ventricular Rate:  89 PR Interval:    QRS Duration: 140 QT Interval:  439 QTC Calculation: 535 R Axis:   -65 Text Interpretation:  Sinus rhythm Ventricular trigeminy Nonspecific IVCD with LAD No significant change was found Reconfirmed by Munir Victorian  MD, Jonny RuizJOHN ([id number]) on 07/16/2016 2:28:45 AM       Labs:  Results for orders placed or performed during the hospital encounter of 07/15/16 (from the past 24 hour(s))  Synovial cell count + diff, w/ crystals     Status: Abnormal   Collection Time: 07/15/16 11:25 PM  Result Value Ref Range   Color, Synovial RED (A) YELLOW   Appearance-Synovial TURBID (A) CLEAR   Crystals, Fluid NO CRYSTALS SEEN    WBC, Synovial 1,680 (H) 0 - 200 /cu mm   Neutrophil, Synovial 62 (H) 0 - 25 %   Lymphocytes-Synovial Fld 27 (H) 0 - 20 %   Monocyte-Macrophage-Synovial Fluid 11 (L) 50 - 90 %   Eosinophils-Synovial 0 0 - 1 %    Imaging Studies: Dg Knee 2 Views Right  Result Date: 07/15/2016 CLINICAL DATA:  Severe RIGHT knee pain.  No injury. EXAM: RIGHT KNEE - 1-2 VIEW COMPARISON:  05/13/2016.  Multiple FINDINGS: No evidence of fracture or dislocation. Severe tricompartmental degenerative change. Large suprapatellar bursa effusion is increased from priors. IMPRESSION: Worsening effusion.  Severe DJD. Electronically Signed   By: Elsie StainJohn T Curnes M.D.   On: 07/15/2016 21:24      Nakiea Metzner, Jonny RuizJohn, MD 07/16/16 0230

## 2016-07-20 LAB — BODY FLUID CULTURE: CULTURE: NO GROWTH

## 2016-07-25 ENCOUNTER — Emergency Department (HOSPITAL_BASED_OUTPATIENT_CLINIC_OR_DEPARTMENT_OTHER)
Admission: EM | Admit: 2016-07-25 | Discharge: 2016-07-26 | Disposition: A | Discharge status: Home / Self Care | Payer: 59 | Attending: Emergency Medicine | Admitting: Emergency Medicine

## 2016-07-25 ENCOUNTER — Emergency Department (HOSPITAL_BASED_OUTPATIENT_CLINIC_OR_DEPARTMENT_OTHER): Payer: 59

## 2016-07-25 ENCOUNTER — Encounter (HOSPITAL_BASED_OUTPATIENT_CLINIC_OR_DEPARTMENT_OTHER): Payer: Self-pay | Admitting: Emergency Medicine

## 2016-07-25 DIAGNOSIS — R079 Chest pain, unspecified: Secondary | ICD-10-CM

## 2016-07-25 DIAGNOSIS — Z79899 Other long term (current) drug therapy: Secondary | ICD-10-CM | POA: Insufficient documentation

## 2016-07-25 DIAGNOSIS — F17210 Nicotine dependence, cigarettes, uncomplicated: Secondary | ICD-10-CM | POA: Insufficient documentation

## 2016-07-25 DIAGNOSIS — R0789 Other chest pain: Secondary | ICD-10-CM | POA: Insufficient documentation

## 2016-07-25 DIAGNOSIS — I10 Essential (primary) hypertension: Secondary | ICD-10-CM | POA: Insufficient documentation

## 2016-07-25 LAB — CBC
HEMATOCRIT: 40.6 % (ref 39.0–52.0)
Hemoglobin: 13.7 g/dL (ref 13.0–17.0)
MCH: 29.1 pg (ref 26.0–34.0)
MCHC: 33.7 g/dL (ref 30.0–36.0)
MCV: 86.2 fL (ref 78.0–100.0)
Platelets: 195 10*3/uL (ref 150–400)
RBC: 4.71 MIL/uL (ref 4.22–5.81)
RDW: 13.6 % (ref 11.5–15.5)
WBC: 7.4 10*3/uL (ref 4.0–10.5)

## 2016-07-25 LAB — BASIC METABOLIC PANEL
Anion gap: 7 (ref 5–15)
BUN: 9 mg/dL (ref 6–20)
CHLORIDE: 105 mmol/L (ref 101–111)
CO2: 27 mmol/L (ref 22–32)
Calcium: 9.4 mg/dL (ref 8.9–10.3)
Creatinine, Ser: 1.37 mg/dL — ABNORMAL HIGH (ref 0.61–1.24)
GFR calc non Af Amer: 59 mL/min — ABNORMAL LOW (ref >60)
Glucose, Bld: 98 mg/dL (ref 65–99)
POTASSIUM: 3.7 mmol/L (ref 3.5–5.1)
SODIUM: 139 mmol/L (ref 135–145)

## 2016-07-25 LAB — TROPONIN I: Troponin I: <0.03 ng/mL (ref <0.03)

## 2016-07-25 NOTE — ED Triage Notes (Signed)
Patient states that he ate a banana earlier today. Patient reports that he was sitting and relaxing when he started to have chest pain about an hour ago

## 2016-07-25 NOTE — Discharge Instructions (Addendum)
Return to the ED with any concerns including difficulty breathing, worsening chest pain, fainting, leg swelling, decreased level of alertness/lethargy, decreased level of alertness/lethargy, or any other alarming symptoms   The following was seen on your chest xray which was performed tonight in the ED-- when you followup with your doctor you should have another chest xray performed- if the abnormality is still there then a chest CT should be done:   Focal opacity behind the heart on the lateral view is somewhat nodular in appearance and new since January 2018. Recommend short-term follow-up. If the findings persist, recommend CT imaging.

## 2016-07-25 NOTE — ED Notes (Signed)
Alert, NAD, calm, interactive, resps e/u, speaking in clear complete sentences, no dyspnea noted, skin W&D, VSS, chest soreness improved, (denies: sob, nausea, dizziness or visual changes). [facility]

## 2016-07-25 NOTE — ED Provider Notes (Signed)
[facility] Provider Note   CSN: [id number] Arrival date & time: 07/25/16  1949  By signing my name below, I, [name], attest that this documentation has been prepared under the direction and in the presence of No att. providers found. Electronically signed: [name], ED Scribe. 07/26/16. 8:28 PM.   History   Chief Complaint Chief Complaint  Patient presents with  . Chest Pain     HPI Jason Black is a 51 y.o. male with hx of A-fib, HTN, GERD who presents to the Emergency Department complaining of chest pain with sudden onset x4 hours. Pt states that he developed chest pain after bringing groceries into the house x4 hours ago. He states that after eating a banana he started feeling weak and nauseated. Pt also reports drinking more coffee than normal today. He reportedly started experiencing palpitations and tried to lie down but could not due to the chest pain. Pt states that he checked his blood pressure to be elevated which prompted pt to come to the ED. He denies any modifying factors. Pt denies nausea, weakness at the moment, syncope, diaphoresis. Of note, pt was diagnosed with A-fib by PCP and prescribed Metoprolol. Pt is a smoker. Pt denies any recent surgeries, long trips.   The history is provided by the patient. No language interpreter was used.  Chest Pain   This is a new problem. The current episode started 3 to 5 hours ago. The problem occurs constantly. The problem has been gradually improving. The pain is associated with exertion. The pain is present in the substernal region. The pain is mild. The quality of the pain is described as pressure-like. Associated symptoms include shortness of breath. Pertinent negatives include no diaphoresis, no nausea, no near-syncope, no vomiting and no weakness. He has tried nothing for the symptoms. The treatment provided no relief. Risk factors include male gender.    Past Medical History:    Diagnosis Date  . Cocaine abuse   . Gout   . High cholesterol   . Hypertension     There are no active problems to display for this patient.   Past Surgical History:  Procedure Laterality Date  . KNEE SURGERY         Home Medications    Prior to Admission medications   Medication Sig Start Date End Date Taking? Authorizing Provider  metoprolol tartrate (LOPRESSOR) 50 MG tablet Take 50 mg by mouth 2 (two) times daily.   Yes [provider]  ranitidine (ZANTAC) 150 MG tablet Take 150 mg by mouth 2 (two) times daily.   Yes [provider]  amLODipine (NORVASC) 10 MG tablet Take 10 mg by mouth daily.    [provider]  hydrochlorothiazide (HYDRODIURIL) 12.5 MG tablet Take 25 mg by mouth daily.    [provider]  ibuprofen (ADVIL,MOTRIN) 800 MG tablet Take 1 tablet (800 mg total) by mouth every 8 (eight) hours as needed. 11/09/14   Elwin Mocha, MD  lisinopril (PRINIVIL,ZESTRIL) 20 MG tablet Take 40 mg by mouth daily.     [provider]  multivitamin-iron-minerals-folic acid (CENTRUM) chewable tablet Chew 1 tablet by mouth daily.    [provider]  omeprazole (PRILOSEC) 40 MG capsule Take 40 mg by mouth daily.    [provider]  pravastatin (PRAVACHOL) 20 MG tablet Take 20 mg by mouth daily.    [provider]  traMADol (ULTRAM) 50 MG tablet Take 1 tablet (50 mg total)  by mouth every 6 (six) hours as needed. 07/16/16   Linwood DibblesKnapp, Jon, MD    Family History Family History  Problem Relation Age of Onset  . Hypertension Mother   . Hypertension Father   . Hypertension Sister   . Hypertension Brother     Social History Social History  Substance Use Topics  . Smoking status: Current Every Day Smoker    Types: Cigarettes  . Smokeless tobacco: Never Used  . Alcohol use No     Allergies   Patient has no known allergies.   Review of Systems Review of Systems  Constitutional: Negative for diaphoresis.   Respiratory: Positive for shortness of breath.   Cardiovascular: Positive for chest pain. Negative for near-syncope.  Gastrointestinal: Negative for nausea and vomiting.  Neurological: Negative for syncope and weakness.  All other systems reviewed and are negative.    Physical Exam Updated Vital Signs BP 126/68   Pulse (!) 25   Resp 13   Ht 5\' 10"  (1.778 m)   Wt 220 lb (99.8 kg)   SpO2 99%   BMI 31.57 kg/m  Vitals reviewed Physical Exam Physical Examination: General appearance - alert, well appearing, and in no distress Mental status - alert, oriented to person, place, and time Eyes - no conjunctival injection, no scleral icterus Mouth - mucous membranes moist, pharynx normal without lesions Neck - supple, no significant adenopathy Chest - clear to auscultation, no wheezes, rales or rhonchi, symmetric air entry Heart - normal rate, regular rhythm, normal S1, S2, no murmurs, rubs, clicks or gallops Abdomen - soft, nontender, nondistended, no masses or organomegaly Neurological - alert, oriented, normal speech Extremities - peripheral pulses normal, no pedal edema, no clubbing or cyanosis Skin - normal coloration and turgor, no rashes  ED Treatments / Results   DIAGNOSTIC STUDIES: Oxygen Saturation is 100% on RA, normal by my interpretation.   COORDINATION OF CARE: 8:28 PM-Discussed next steps with pt. Pt verbalized understanding and is agreeable with the plan.    Labs (all labs ordered are listed, but only abnormal results are displayed) Labs Reviewed  BASIC METABOLIC PANEL - Abnormal; Notable for the following:       Result Value   Creatinine, Ser 1.37 (*)    GFR calc non Af Amer 59 (*)    All other components within normal limits  CBC  TROPONIN I  TROPONIN I    EKG  EKG Interpretation  Date/Time:  Saturday Jul 25 2016 19:57:40 EDT Ventricular Rate:  60 PR Interval:  128 QRS Duration: 94 QT Interval:  422 QTC Calculation: 422 R Axis:   -20 Text  Interpretation:  Normal sinus rhythm Incomplete right bundle branch block Borderline ECG Since previous tracing pvcs are no longer present Confirmed by Jerelyn ScottLinker, Galvin Aversa [phone]([id number]) on 07/25/2016 8:59:50 PM       Radiology Dg Chest 2 View  Result Date: 07/25/2016 CLINICAL DATA:  Chest pain and finger numbness. EXAM: CHEST  2 VIEW COMPARISON:  March 17, 2016 FINDINGS: No pneumothorax. The heart, hila, and mediastinum are normal. Coarsened interstitium again identified. Focal somewhat nodular opacity behind the heart on the lateral view was not seen on the comparison study. No other acute abnormalities. IMPRESSION: Focal opacity behind the heart on the lateral view is somewhat nodular in appearance and new since January 2018. Recommend short-term follow-up. If the findings persist, recommend CT imaging. Electronically Signed   By: Gerome Samavid  Williams III M.D   On: 07/25/2016 20:22    Procedures Procedures (including critical  care time)  Medications Ordered in ED Medications - No data to display   Initial Impression / Assessment and Plan / ED Course  I have reviewed the triage vital signs and the nursing notes.  Pertinent labs & imaging results that were available during my care of the patient were reviewed by me and considered in my medical decision making (see chart for details).    12:23 AM pt has a heart score of 3, 2 sets of troponins are negative.  He has some renal insufficiency which is at his baseline.  He was notified about abnormality on cxr.    Pt presenting with c/o chest pain associated with palpitations earlier today. Pt states he has had similar palpitations in the past and this is why he takes metoprolol.  No longer has chest pain.  Heart score is 3, 2 sets of troponins are negative.  Pt advised close outpatient followup.  Discharged with strict return precautions.  Pt agreeable with plan.  Final Clinical Impressions(s) / ED Diagnoses   Final diagnoses:  Chest pain, unspecified type      New Prescriptions Discharge Medication List as of 07/26/2016 12:21 AM     I personally performed the services described in this documentation, which was scribed in my presence. The recorded information has been reviewed and is accurate.      Jerelyn Scott, MD 07/26/16 2231

## 2016-07-26 LAB — TROPONIN I

## 2016-07-26 NOTE — ED Notes (Signed)
No changes, resting, lying on R side, NAD, calm, interactive, "feel better", denies sx[facility]

## 2016-07-26 NOTE — ED Notes (Signed)
Pt and SO given d/c instructions as per chart. Verbalize understanding. No questions. 

## 2018-05-06 ENCOUNTER — Ambulatory Visit
Admission: RE | Admit: 2018-05-06 | Discharge: 2018-05-06 | Disposition: A | Discharge status: Home / Self Care | Payer: BLUE CROSS/BLUE SHIELD | Source: Ambulatory Visit | Attending: Physician Assistant | Admitting: Physician Assistant

## 2018-05-06 ENCOUNTER — Other Ambulatory Visit: Payer: Self-pay | Admitting: Physician Assistant

## 2018-05-06 DIAGNOSIS — M23306 Other meniscus derangements, unspecified meniscus, right knee: Secondary | ICD-10-CM

## 2019-02-27 ENCOUNTER — Other Ambulatory Visit: Payer: Self-pay | Admitting: Orthopaedic Surgery

## 2019-03-23 NOTE — Patient Instructions (Addendum)
DUE TO COVID-19 ONLY ONE VISITOR IS ALLOWED TO COME WITH YOU AND STAY IN THE WAITING ROOM ONLY DURING PRE OP AND PROCEDURE DAY OF SURGERY. THE 1 VISITOR MAY VISIT WITH YOU AFTER SURGERY IN YOUR PRIVATE ROOM DURING VISITING HOURS ONLY!  YOU NEED TO HAVE A COVID 19 TEST ON: 03/24/19 @ 2:40 PM    , THIS TEST MUST BE DONE BEFORE SURGERY, COME  [address], [place] , [address].  [facility]) ONCE YOUR COVID TEST IS COMPLETED, PLEASE BEGIN THE QUARANTINE INSTRUCTIONS AS OUTLINED IN YOUR HANDOUT.                Jason Black      Your procedure is scheduled on: 03/28/19     Report to [facility] Main  Entrance   Report to admitting at: 10:00 AM     Call this number if you have problems the morning of surgery [phone]    Remember:    . BRUSH YOUR TEETH MORNING OF SURGERY AND RINSE YOUR MOUTH OUT, NO CHEWING GUM CANDY OR MINTS.     Take these medicines the morning of surgery with A SIP OF WATER: ALLOPURINOL,HYDRALAZINE,ISOSORBIDE. MITIGARE AS NEEDED.       You may not have any metal on your body including hair pins and              piercings  Do not wear jewelry, lotions, powders or perfumes, deodorant             Men may shave face and neck.     Do not bring valuables to the hospital. [facility] IS NOT             RESPONSIBLE   FOR VALUABLES.  Contacts, dentures or bridgework may not be worn into surgery.  Leave suitcase in the car. After surgery it may be brought to your room.                  NO SOLID FOOD AFTER MIDNIGHT THE NIGHT PRIOR TO SURGERY. NOTHING BY MOUTH EXCEPT CLEAR LIQUIDS UNTIL: 9:20 AM . PLEASE FINISH ENSURE DRINK PER SURGEON ORDER  WHICH NEEDS TO BE COMPLETED AT: 9:20 AM .   CLEAR LIQUID DIET   Foods Allowed                                                                     Foods Excluded  Coffee and tea, regular and decaf                             liquids that you cannot  Plain Jell-O any favor except red or  purple                                           see through such as: Fruit ices (not with fruit pulp)                                     milk, soups,  orange juice  Iced Popsicles                                    All solid food Carbonated beverages, regular and diet                                    Cranberry, grape and apple juices Sports drinks like Gatorade Lightly seasoned clear broth or consume(fat free) Sugar, honey syrup  Sample Menu Breakfast                                Lunch                                     Supper Cranberry juice                    Beef broth                            Chicken broth Jell-O                                     Grape juice                           Apple juice Coffee or tea                        Jell-O                                      Popsicle                                                Coffee or tea                        Coffee or tea  _____________________________________________________________________    [facility] - Preparing for Surgery Before surgery, you can play an important role.  Because skin is not sterile, your skin needs to be as free of germs as possible.  You can reduce the number of germs on your skin by washing with CHG (chlorahexidine gluconate) soap before surgery.  CHG is an antiseptic cleaner which kills germs and bonds with the skin to continue killing germs even after washing. Please DO NOT use if you have an allergy to CHG or antibacterial soaps.  If your skin becomes reddened/irritated stop using the CHG and inform your nurse when you arrive at Short Stay. Do not shave (including legs and underarms) for at least 48 hours prior to the first CHG shower.  You may shave your face/neck. Please follow these instructions carefully:  1.  Shower with CHG Soap the night before surgery and the  morning of Surgery.  2.  If you choose to wash  your hair, wash your hair first as usual with your  normal  shampoo.  3.   After you shampoo, rinse your hair and body thoroughly to remove the  shampoo.                           4.  Use CHG as you would any other liquid soap.  You can apply chg directly  to the skin and wash                       Gently with a scrungie or clean washcloth.  5.  Apply the CHG Soap to your body ONLY FROM THE NECK DOWN.   Do not use on face/ open                           Wound or open sores. Avoid contact with eyes, ears mouth and genitals (private parts).                       Wash face,  Genitals (private parts) with your normal soap.             6.  Wash thoroughly, paying special attention to the area where your surgery  will be performed.  7.  Thoroughly rinse your body with warm water from the neck down.  8.  DO NOT shower/wash with your normal soap after using and rinsing off  the CHG Soap.                9.  Pat yourself dry with a clean towel.            10.  Wear clean pajamas.            11.  Place clean sheets on your bed the night of your first shower and do not  sleep with pets. Day of Surgery : Do not apply any lotions/deodorants the morning of surgery.  Please wear clean clothes to the hospital/surgery center.  FAILURE TO FOLLOW THESE INSTRUCTIONS MAY RESULT IN THE CANCELLATION OF YOUR SURGERY PATIENT SIGNATURE_________________________________  NURSE SIGNATURE__________________________________  ________________________________________________________________________      Jason Black  An incentive spirometer is a tool that can help keep your lungs clear and active. This tool measures how well you are filling your lungs with each breath. Taking long deep breaths may help reverse or decrease the chance of developing breathing (pulmonary) problems (especially infection) following:  A long period of time when you are unable to move or be active. BEFORE THE PROCEDURE   If the spirometer includes an indicator to show your best effort, your nurse or  respiratory therapist will set it to a desired goal.  If possible, sit up straight or lean slightly forward. Try not to slouch.  Hold the incentive spirometer in an upright position. INSTRUCTIONS FOR USE  1. Sit on the edge of your bed if possible, or sit up as far as you can in bed or on a chair. 2. Hold the incentive spirometer in an upright position. 3. Breathe out normally. 4. Place the mouthpiece in your mouth and seal your lips tightly around it. 5. Breathe in slowly and as deeply as possible, raising the piston or the ball toward the top of the column. 6. Hold your breath for 3-5 seconds or for as long as possible. Allow the piston or ball to fall  to the bottom of the column. 7. Remove the mouthpiece from your mouth and breathe out normally. 8. Rest for a few seconds and repeat Steps 1 through 7 at least 10 times every 1-2 hours when you are awake. Take your time and take a few normal breaths between deep breaths. 9. The spirometer may include an indicator to show your best effort. Use the indicator as a goal to work toward during each repetition. 10. After each set of 10 deep breaths, practice coughing to be sure your lungs are clear. If you have an incision (the cut made at the time of surgery), support your incision when coughing by placing a pillow or rolled up towels firmly against it. Once you are able to get out of bed, walk around indoors and cough well. You may stop using the incentive spirometer when instructed by your caregiver.  RISKS AND COMPLICATIONS  Take your time so you do not get dizzy or light-headed.  If you are in pain, you may need to take or ask for pain medication before doing incentive spirometry. It is harder to take a deep breath if you are having pain. AFTER USE  Rest and breathe slowly and easily.  It can be helpful to keep track of a log of your progress. Your caregiver can provide you with a simple table to help with this. If you are using the  spirometer at home, follow these instructions: SEEK MEDICAL CARE IF:   You are having difficultly using the spirometer.  You have trouble using the spirometer as often as instructed.  Your pain medication is not giving enough relief while using the spirometer.  You develop fever of 100.5 F (38.1 C) or higher. SEEK IMMEDIATE MEDICAL CARE IF:   You cough up bloody sputum that had not been present before.  You develop fever of 102 F (38.9 C) or greater.  You develop worsening pain at or near the incision site. MAKE SURE YOU:   Understand these instructions.  Will watch your condition.  Will get help right away if you are not doing well or get worse. Document Released: 07/06/2006 Document Revised: 05/18/2011 Document Reviewed: 09/06/2006 [facility] Patient Information 2014 [place], [place].   ________________________________________________________________________

## 2019-03-24 ENCOUNTER — Encounter (HOSPITAL_COMMUNITY)
Admission: RE | Admit: 2019-03-24 | Discharge: 2019-03-24 | Disposition: A | Discharge status: Home / Self Care | Payer: BC Managed Care – PPO | Source: Ambulatory Visit | Attending: Orthopaedic Surgery | Admitting: Orthopaedic Surgery

## 2019-03-24 ENCOUNTER — Other Ambulatory Visit: Payer: Self-pay

## 2019-03-24 ENCOUNTER — Ambulatory Visit (HOSPITAL_COMMUNITY)
Admission: RE | Admit: 2019-03-24 | Discharge: 2019-03-24 | Disposition: A | Discharge status: Home / Self Care | Payer: BC Managed Care – PPO | Source: Ambulatory Visit | Attending: Orthopaedic Surgery | Admitting: Orthopaedic Surgery

## 2019-03-24 ENCOUNTER — Encounter (HOSPITAL_COMMUNITY): Payer: Self-pay

## 2019-03-24 ENCOUNTER — Other Ambulatory Visit (HOSPITAL_COMMUNITY)
Admission: RE | Admit: 2019-03-24 | Discharge: 2019-03-24 | Disposition: A | Discharge status: Home / Self Care | Payer: BC Managed Care – PPO | Source: Ambulatory Visit | Attending: Orthopaedic Surgery | Admitting: Orthopaedic Surgery

## 2019-03-24 DIAGNOSIS — Z01818 Encounter for other preprocedural examination: Secondary | ICD-10-CM | POA: Diagnosis not present

## 2019-03-24 LAB — CBC WITH DIFFERENTIAL/PLATELET
Abs Immature Granulocytes: 0.01 10*3/uL (ref 0.00–0.07)
Basophils Absolute: 0 10*3/uL (ref 0.0–0.1)
Basophils Relative: 0 %
Eosinophils Absolute: 0.1 10*3/uL (ref 0.0–0.5)
Eosinophils Relative: 1 %
HCT: 41.9 % (ref 39.0–52.0)
Hemoglobin: 13.7 g/dL (ref 13.0–17.0)
Immature Granulocytes: 0 %
Lymphocytes Relative: 32 %
Lymphs Abs: 2.1 10*3/uL (ref 0.7–4.0)
MCH: 29 pg (ref 26.0–34.0)
MCHC: 32.7 g/dL (ref 30.0–36.0)
MCV: 88.8 fL (ref 80.0–100.0)
Monocytes Absolute: 0.6 10*3/uL (ref 0.1–1.0)
Monocytes Relative: 9 %
Neutro Abs: 3.8 10*3/uL (ref 1.7–7.7)
Neutrophils Relative %: 58 %
Platelets: 156 10*3/uL (ref 150–400)
RBC: 4.72 MIL/uL (ref 4.22–5.81)
RDW: 13.4 % (ref 11.5–15.5)
WBC: 6.7 10*3/uL (ref 4.0–10.5)
nRBC: 0 % (ref 0.0–0.2)

## 2019-03-24 LAB — BASIC METABOLIC PANEL
Anion gap: 8 (ref 5–15)
BUN: 14 mg/dL (ref 6–20)
CO2: 27 mmol/L (ref 22–32)
Calcium: 9.1 mg/dL (ref 8.9–10.3)
Chloride: 106 mmol/L (ref 98–111)
Creatinine, Ser: 1.31 mg/dL — ABNORMAL HIGH (ref 0.61–1.24)
GFR calc Af Amer: >60 mL/min (ref >60)
GFR calc non Af Amer: >60 mL/min (ref >60)
Glucose, Bld: 122 mg/dL — ABNORMAL HIGH (ref 70–99)
Potassium: 4.1 mmol/L (ref 3.5–5.1)
Sodium: 141 mmol/L (ref 135–145)

## 2019-03-24 LAB — ABO/RH: ABO/RH(D): O POS

## 2019-03-24 LAB — URINALYSIS, ROUTINE W REFLEX MICROSCOPIC
Bilirubin Urine: NEGATIVE
Glucose, UA: NEGATIVE mg/dL
Hgb urine dipstick: NEGATIVE
Ketones, ur: NEGATIVE mg/dL
Leukocytes,Ua: NEGATIVE
Nitrite: NEGATIVE
Protein, ur: NEGATIVE mg/dL
Specific Gravity, Urine: 1.009 (ref 1.005–1.030)
pH: 6 (ref 5.0–8.0)

## 2019-03-24 LAB — APTT: aPTT: 28 seconds (ref 24–36)

## 2019-03-24 LAB — PROTIME-INR
INR: 1 (ref 0.8–1.2)
Prothrombin Time: 12.7 seconds (ref 11.4–15.2)

## 2019-03-24 LAB — SURGICAL PCR SCREEN
MRSA, PCR: NEGATIVE
Staphylococcus aureus: NEGATIVE

## 2019-03-24 NOTE — Progress Notes (Signed)
Echocardiogram record requested from [facility] [place].

## 2019-03-24 NOTE — Progress Notes (Signed)
PCP -  Cardiologist -   Chest x-ray -  EKG -  Stress Test -  ECHO -  Cardiac Cath -   Sleep Study -  CPAP -   Fasting Blood Sugar -  Checks Blood Sugar _____ times a day  Blood Thinner Instructions: Aspirin INSTRUCTIONS:PT. WAS ADVISED BY RN. TO CHECK WITH SURGEON'S OFFICE ABOUT ASPIRIN INSTRUCTIONS.RN EXPLAINED HOW ASPIRIN AFFECT COAGULATION. Last Dose:03/24/19. Pt. York Spaniel he will not take aspirin after today.  Anesthesia review:   Patient denies shortness of breath, fever, cough and chest pain at PAT appointment   Patient verbalized understanding of instructions that were given to them at the PAT appointment. Patient was also instructed that they will need to review over the PAT instructions again at home before surgery.

## 2019-03-24 NOTE — H&P (Signed)
TOTAL KNEE ADMISSION H&P  Patient is being admitted for right total knee arthroplasty.  Subjective:  Chief Complaint:right knee pain.  HPI: Jason Black, 54 y.o. male, has a history of pain and functional disability in the right knee due to arthritis and has failed non-surgical conservative treatments for greater than 12 weeks to includeNSAID's and/or analgesics, corticosteriod injections, viscosupplementation injections, flexibility and strengthening excercises, use of assistive devices, weight reduction as appropriate and activity modification.  Onset of symptoms was gradual, starting 5 years ago with gradually worsening course since that time. The patient noted no past surgery on the right knee(s).  Patient currently rates pain in the right knee(s) at 10 out of 10 with activity. Patient has night pain, worsening of pain with activity and weight bearing, pain that interferes with activities of daily living, crepitus and joint swelling.  Patient has evidence of subchondral cysts, subchondral sclerosis, periarticular osteophytes and joint space narrowing by imaging studies. There is no active infection.  There are no problems to display for this patient.  Past Medical History:  Diagnosis Date  . Cocaine abuse   . Gout   . High cholesterol   . Hypertension     Past Surgical History:  Procedure Laterality Date  . KNEE SURGERY      No current facility-administered medications for this encounter.   Current Outpatient Medications  Medication Sig Dispense Refill Last Dose  . allopurinol (ZYLOPRIM) 300 MG tablet Take 300 mg by mouth daily.     Marland Kitchen aspirin EC 81 MG tablet Take 81 mg by mouth daily.     Marland Kitchen atorvastatin (LIPITOR) 40 MG tablet Take 40 mg by mouth at bedtime.     . Camphor-Menthol-Methyl Sal (TIGER BALM MUSCLE RUB EX) Apply 1 application topically at bedtime.     . hydrALAZINE (APRESOLINE) 100 MG tablet Take 100 mg by mouth 2 (two) times daily.     . isosorbide dinitrate  (ISORDIL) 30 MG tablet Take 30 mg by mouth daily.     Marland Kitchen lisinopril (ZESTRIL) 40 MG tablet Take 40 mg by mouth daily.     . meloxicam (MOBIC) 15 MG tablet Take 15 mg by mouth every evening.     Marland Kitchen MITIGARE 0.6 MG CAPS Take 0.6-1.2 mg by mouth See admin instructions. Take 1.2 mg at first sign of gout then take 0.6 mg 1 hour later then take 0.6 mg once daily until gout pain subsides     . Multiple Vitamin (MULTIVITAMIN WITH MINERALS) TABS tablet Take 1 tablet by mouth daily.      No Known Allergies  Social History   Tobacco Use  . Smoking status: Current Every Day Smoker    Types: Cigarettes  . Smokeless tobacco: Never Used  Substance Use Topics  . Alcohol use: No    Family History  Problem Relation Age of Onset  . Hypertension Mother   . Hypertension Father   . Hypertension Sister   . Hypertension Brother      Review of Systems  Musculoskeletal: Positive for arthralgias.       Right knee  All other systems reviewed and are negative.   Objective:  Physical Exam  Constitutional: He is oriented to person, place, and time. He appears well-developed and well-nourished.  HENT:  Head: Normocephalic and atraumatic.  Eyes: Pupils are equal, round, and reactive to light.  Cardiovascular: Normal rate and regular rhythm.  Respiratory: Effort normal.  GI: Soft.  Musculoskeletal:     Cervical back: Normal range of  motion.     Comments: Right knee motion is 5-90.  He has medial joint line pain and crepitation.  There is no effusion.  Hip motion is full and straight leg raise is negative.  Sensation and motor function are intact in his feet with palpable pulses on both sides.    Neurological: He is alert and oriented to person, place, and time.  Skin: Skin is warm and dry.  Psychiatric: He has a normal mood and affect. His behavior is normal. Judgment and thought content normal.    Vital signs in last 24 hours: BP: ()/()  Arterial Line BP: ()/()   Labs:   Estimated body mass  index is 31.57 kg/m as calculated from the following:   Height as of 07/25/16: 5\' 10"  (1.778 m).   Weight as of 07/25/16: 99.8 kg.   Imaging Review Plain radiographs demonstrate severe degenerative joint disease of the right knee(s). The overall alignment isneutral. The bone quality appears to be good for age and reported activity level.      Assessment/Plan:  End stage primary arthritis, right knee   The patient history, physical examination, clinical judgment of the provider and imaging studies are consistent with end stage degenerative joint disease of the right knee(s) and total knee arthroplasty is deemed medically necessary. The treatment options including medical management, injection therapy arthroscopy and arthroplasty were discussed at length. The risks and benefits of total knee arthroplasty were presented and reviewed. The risks due to aseptic loosening, infection, stiffness, patella tracking problems, thromboembolic complications and other imponderables were discussed. The patient acknowledged the explanation, agreed to proceed with the plan and consent was signed. Patient is being admitted for inpatient treatment for surgery, pain control, PT, OT, prophylactic antibiotics, VTE prophylaxis, progressive ambulation and ADL's and discharge planning. The patient is planning to be discharged home with home health services     Patient's anticipated LOS is less than 2 midnights, meeting these requirements: - Younger than 11 - Lives within 1 hour of care - Has a competent adult at home to recover with post-op recover - NO history of  - Chronic pain requiring opiods  - Diabetes  - Coronary Artery Disease  - Heart failure  - Heart attack  - Stroke  - DVT/VTE  - Cardiac arrhythmia  - Respiratory Failure/COPD  - Renal failure  - Anemia  - Advanced Liver disease

## 2019-03-25 LAB — NOVEL CORONAVIRUS, NAA (HOSP ORDER, SEND-OUT TO REF LAB; TAT 18-24 HRS): SARS-CoV-2, NAA: NOT DETECTED

## 2019-03-26 NOTE — Care Plan (Signed)
Ortho Bundle Case Management Note  Patient Details  Name: [name] MRN: [id number] Date of Birth: 1965-09-28   Will discharge to home with family. HHPT referral to [facility]. OPPT set up with [facility] [facility]. Rolling walker ordered from [employer]. Patient and MD in agreement with plan. Choice offered.                  DME Arranged:  Dan Humphreys rolling DME Agency:  [employer]  HH Arranged:  PT HH Agency:  [facility] ([facility])  Additional Comments: Please contact me with any questions of if this plan should need to change.  Shauna Hugh,  RN,BSN,MHA,CCM  [facility]  [phone] 03/26/2019, 9:27 PM

## 2019-03-27 ENCOUNTER — Encounter (HOSPITAL_COMMUNITY): Payer: Self-pay | Admitting: Orthopaedic Surgery

## 2019-03-27 MED ORDER — BUPIVACAINE LIPOSOME 1.3 % IJ SUSP
20.0000 mL | Freq: Once | INTRAMUSCULAR | Status: DC
  Filled 2019-03-27: qty 20

## 2019-03-27 MED ORDER — TRANEXAMIC ACID 1000 MG/10ML IV SOLN
2000.0000 mg | INTRAVENOUS | Status: DC
  Filled 2019-03-27: qty 20

## 2019-03-28 ENCOUNTER — Encounter (HOSPITAL_COMMUNITY): Payer: Self-pay | Admitting: Orthopaedic Surgery

## 2019-03-28 ENCOUNTER — Ambulatory Visit (HOSPITAL_COMMUNITY): Payer: BC Managed Care – PPO | Admitting: Physician Assistant

## 2019-03-28 ENCOUNTER — Encounter (HOSPITAL_COMMUNITY)
Admission: RE | Disposition: A | Discharge status: Home / Self Care | Payer: Self-pay | Source: Home / Self Care | Attending: Orthopaedic Surgery

## 2019-03-28 ENCOUNTER — Observation Stay (HOSPITAL_COMMUNITY)
Admission: RE | Admit: 2019-03-28 | Discharge: 2019-03-29 | Disposition: A | Discharge status: Home / Self Care | Payer: BC Managed Care – PPO | Attending: Orthopaedic Surgery | Admitting: Orthopaedic Surgery

## 2019-03-28 DIAGNOSIS — E7800 Pure hypercholesterolemia, unspecified: Secondary | ICD-10-CM | POA: Diagnosis not present

## 2019-03-28 DIAGNOSIS — F17210 Nicotine dependence, cigarettes, uncomplicated: Secondary | ICD-10-CM | POA: Diagnosis not present

## 2019-03-28 DIAGNOSIS — Z8249 Family history of ischemic heart disease and other diseases of the circulatory system: Secondary | ICD-10-CM | POA: Diagnosis not present

## 2019-03-28 DIAGNOSIS — M1711 Unilateral primary osteoarthritis, right knee: Principal | ICD-10-CM | POA: Diagnosis present

## 2019-03-28 DIAGNOSIS — Z7982 Long term (current) use of aspirin: Secondary | ICD-10-CM | POA: Diagnosis not present

## 2019-03-28 DIAGNOSIS — Z79899 Other long term (current) drug therapy: Secondary | ICD-10-CM | POA: Diagnosis not present

## 2019-03-28 DIAGNOSIS — Z6832 Body mass index (BMI) 32.0-32.9, adult: Secondary | ICD-10-CM | POA: Diagnosis not present

## 2019-03-28 DIAGNOSIS — I10 Essential (primary) hypertension: Secondary | ICD-10-CM | POA: Insufficient documentation

## 2019-03-28 DIAGNOSIS — Z791 Long term (current) use of non-steroidal anti-inflammatories (NSAID): Secondary | ICD-10-CM | POA: Diagnosis not present

## 2019-03-28 DIAGNOSIS — M109 Gout, unspecified: Secondary | ICD-10-CM | POA: Diagnosis not present

## 2019-03-28 DIAGNOSIS — E669 Obesity, unspecified: Secondary | ICD-10-CM | POA: Insufficient documentation

## 2019-03-28 HISTORY — PX: TOTAL KNEE ARTHROPLASTY: SHX125

## 2019-03-28 LAB — TYPE AND SCREEN
ABO/RH(D): O POS
Antibody Screen: NEGATIVE

## 2019-03-28 SURGERY — ARTHROPLASTY, KNEE, TOTAL
Anesthesia: Spinal | Site: Knee | Laterality: Right

## 2019-03-28 MED ORDER — PROPOFOL 10 MG/ML IV BOLUS
INTRAVENOUS | Status: DC | PRN
  Administered 2019-03-28: 30 mg via INTRAVENOUS

## 2019-03-28 MED ORDER — OXYCODONE HCL 5 MG/5ML PO SOLN: 5.0000 mg | Freq: Once | ORAL | Status: DC | PRN

## 2019-03-28 MED ORDER — HYDROMORPHONE HCL 1 MG/ML IJ SOLN
INTRAMUSCULAR | Status: AC
  Filled 2019-03-28: qty 1

## 2019-03-28 MED ORDER — HYDROCODONE-ACETAMINOPHEN 5-325 MG PO TABS
ORAL_TABLET | ORAL | Status: AC
  Filled 2019-03-28: qty 1

## 2019-03-28 MED ORDER — HYDROCODONE-ACETAMINOPHEN 5-325 MG PO TABS
1.0000 | ORAL_TABLET | ORAL | Status: DC | PRN
  Administered 2019-03-28: 1 via ORAL

## 2019-03-28 MED ORDER — CHLORHEXIDINE GLUCONATE 4 % EX LIQD: 60.0000 mL | Freq: Once | CUTANEOUS | Status: DC

## 2019-03-28 MED ORDER — ASPIRIN EC 81 MG PO TBEC: 81.0000 mg | DELAYED_RELEASE_TABLET | Freq: Two times a day (BID) | ORAL | 0 refills | Status: AC

## 2019-03-28 MED ORDER — ISOSORBIDE DINITRATE 20 MG PO TABS
30.0000 mg | ORAL_TABLET | Freq: Every day | ORAL | Status: DC
  Filled 2019-03-28: qty 1

## 2019-03-28 MED ORDER — HYDROCODONE-ACETAMINOPHEN 5-325 MG PO TABS: 1.0000 | ORAL_TABLET | Freq: Four times a day (QID) | ORAL | 0 refills | Status: AC | PRN

## 2019-03-28 MED ORDER — CEFAZOLIN SODIUM-DEXTROSE 2-4 GM/100ML-% IV SOLN
2.0000 g | INTRAVENOUS | Status: AC
  Administered 2019-03-28: 14:00:00 2 g via INTRAVENOUS
  Filled 2019-03-28: qty 100

## 2019-03-28 MED ORDER — BISACODYL 5 MG PO TBEC: 5.0000 mg | DELAYED_RELEASE_TABLET | Freq: Every day | ORAL | Status: DC | PRN

## 2019-03-28 MED ORDER — TIZANIDINE HCL 4 MG PO TABS: 4.0000 mg | ORAL_TABLET | Freq: Four times a day (QID) | ORAL | 1 refills | Status: AC | PRN

## 2019-03-28 MED ORDER — ACETAMINOPHEN 500 MG PO TABS
500.0000 mg | ORAL_TABLET | Freq: Four times a day (QID) | ORAL | Status: DC
  Administered 2019-03-29 (×3): 500 mg via ORAL
  Filled 2019-03-28 (×3): qty 1

## 2019-03-28 MED ORDER — MENTHOL 3 MG MT LOZG: 1.0000 | LOZENGE | OROMUCOSAL | Status: DC | PRN

## 2019-03-28 MED ORDER — BUPIVACAINE LIPOSOME 1.3 % IJ SUSP
INTRAMUSCULAR | Status: DC | PRN
  Administered 2019-03-28: 20 mL

## 2019-03-28 MED ORDER — CLONIDINE HCL (ANALGESIA) 100 MCG/ML EP SOLN
EPIDURAL | Status: DC | PRN
  Administered 2019-03-28: 75 ug

## 2019-03-28 MED ORDER — LACTATED RINGERS IV SOLN: INTRAVENOUS | Status: DC

## 2019-03-28 MED ORDER — METHOCARBAMOL 500 MG IVPB - SIMPLE MED
INTRAVENOUS | Status: AC
  Filled 2019-03-28: qty 50

## 2019-03-28 MED ORDER — HYDRALAZINE HCL 50 MG PO TABS
100.0000 mg | ORAL_TABLET | Freq: Two times a day (BID) | ORAL | Status: DC
  Administered 2019-03-28 – 2019-03-29 (×2): 100 mg via ORAL
  Filled 2019-03-28 (×2): qty 2

## 2019-03-28 MED ORDER — TRANEXAMIC ACID-NACL 1000-0.7 MG/100ML-% IV SOLN
1000.0000 mg | Freq: Once | INTRAVENOUS | Status: AC
  Administered 2019-03-28: 1000 mg via INTRAVENOUS

## 2019-03-28 MED ORDER — MORPHINE SULFATE (PF) 2 MG/ML IV SOLN: 0.5000 mg | INTRAVENOUS | Status: DC | PRN

## 2019-03-28 MED ORDER — METOCLOPRAMIDE HCL 5 MG/ML IJ SOLN: 5.0000 mg | Freq: Three times a day (TID) | INTRAMUSCULAR | Status: DC | PRN

## 2019-03-28 MED ORDER — DIPHENHYDRAMINE HCL 12.5 MG/5ML PO ELIX
12.5000 mg | ORAL_SOLUTION | ORAL | Status: DC | PRN
  Administered 2019-03-28: 25 mg via ORAL
  Filled 2019-03-28: qty 10

## 2019-03-28 MED ORDER — SODIUM CHLORIDE (PF) 0.9 % IJ SOLN
INTRAMUSCULAR | Status: DC | PRN
  Administered 2019-03-28: 30 mL

## 2019-03-28 MED ORDER — PROMETHAZINE HCL 25 MG/ML IJ SOLN: 6.2500 mg | INTRAMUSCULAR | Status: DC | PRN

## 2019-03-28 MED ORDER — COLCHICINE 0.6 MG PO TABS: 0.6000 mg | ORAL_TABLET | Freq: Every day | ORAL | Status: DC | PRN

## 2019-03-28 MED ORDER — METOCLOPRAMIDE HCL 5 MG PO TABS: 5.0000 mg | ORAL_TABLET | Freq: Three times a day (TID) | ORAL | Status: DC | PRN

## 2019-03-28 MED ORDER — TRANEXAMIC ACID 1000 MG/10ML IV SOLN
INTRAVENOUS | Status: DC | PRN
  Administered 2019-03-28: 15:00:00 2000 mg via TOPICAL

## 2019-03-28 MED ORDER — HYDROMORPHONE HCL 1 MG/ML IJ SOLN
0.2500 mg | INTRAMUSCULAR | Status: DC | PRN
  Administered 2019-03-28 (×3): 0.5 mg via INTRAVENOUS

## 2019-03-28 MED ORDER — OXYCODONE HCL 5 MG PO TABS: 5.0000 mg | ORAL_TABLET | Freq: Once | ORAL | Status: DC | PRN

## 2019-03-28 MED ORDER — LACTATED RINGERS IV BOLUS
250.0000 mL | Freq: Once | INTRAVENOUS | Status: AC
  Administered 2019-03-28: 250 mL via INTRAVENOUS

## 2019-03-28 MED ORDER — ONDANSETRON HCL 4 MG/2ML IJ SOLN: 4.0000 mg | Freq: Four times a day (QID) | INTRAMUSCULAR | Status: DC | PRN

## 2019-03-28 MED ORDER — ALLOPURINOL 300 MG PO TABS
300.0000 mg | ORAL_TABLET | Freq: Every day | ORAL | Status: DC
  Administered 2019-03-29: 300 mg via ORAL
  Filled 2019-03-28: qty 1

## 2019-03-28 MED ORDER — ALUM & MAG HYDROXIDE-SIMETH 200-200-20 MG/5ML PO SUSP: 30.0000 mL | ORAL | Status: DC | PRN

## 2019-03-28 MED ORDER — TRANEXAMIC ACID-NACL 1000-0.7 MG/100ML-% IV SOLN
INTRAVENOUS | Status: AC
  Filled 2019-03-28: qty 100

## 2019-03-28 MED ORDER — CEFAZOLIN SODIUM-DEXTROSE 2-4 GM/100ML-% IV SOLN
INTRAVENOUS | Status: AC
  Filled 2019-03-28: qty 100

## 2019-03-28 MED ORDER — SODIUM CHLORIDE 0.9 % IR SOLN
Status: DC | PRN
  Administered 2019-03-28: 3000 mL

## 2019-03-28 MED ORDER — DEXAMETHASONE SODIUM PHOSPHATE 10 MG/ML IJ SOLN
INTRAMUSCULAR | Status: DC | PRN
  Administered 2019-03-28: 10 mg via INTRAVENOUS

## 2019-03-28 MED ORDER — STERILE WATER FOR IRRIGATION IR SOLN
Status: DC | PRN
  Administered 2019-03-28: 2000 mL

## 2019-03-28 MED ORDER — FENTANYL CITRATE (PF) 100 MCG/2ML IJ SOLN
INTRAMUSCULAR | Status: DC | PRN
  Administered 2019-03-28: 100 ug via INTRAVENOUS

## 2019-03-28 MED ORDER — ONDANSETRON HCL 4 MG PO TABS: 4.0000 mg | ORAL_TABLET | Freq: Four times a day (QID) | ORAL | Status: DC | PRN

## 2019-03-28 MED ORDER — POVIDONE-IODINE 10 % EX SWAB
2.0000 "application " | Freq: Once | CUTANEOUS | Status: AC
  Administered 2019-03-28: 2 via TOPICAL

## 2019-03-28 MED ORDER — BUPIVACAINE-EPINEPHRINE 0.25% -1:200000 IJ SOLN
INTRAMUSCULAR | Status: DC | PRN
  Administered 2019-03-28: 25 mL

## 2019-03-28 MED ORDER — ASPIRIN 81 MG PO CHEW
81.0000 mg | CHEWABLE_TABLET | Freq: Two times a day (BID) | ORAL | Status: DC
  Administered 2019-03-29: 81 mg via ORAL
  Filled 2019-03-28: qty 1

## 2019-03-28 MED ORDER — METHOCARBAMOL 500 MG PO TABS
500.0000 mg | ORAL_TABLET | Freq: Four times a day (QID) | ORAL | Status: DC | PRN
  Administered 2019-03-29: 500 mg via ORAL
  Filled 2019-03-28: qty 1

## 2019-03-28 MED ORDER — LACTATED RINGERS IV BOLUS
500.0000 mL | Freq: Once | INTRAVENOUS | Status: AC
  Administered 2019-03-28: 500 mL via INTRAVENOUS

## 2019-03-28 MED ORDER — PROPOFOL 500 MG/50ML IV EMUL
INTRAVENOUS | Status: DC | PRN
  Administered 2019-03-28: 75 ug/kg/min via INTRAVENOUS

## 2019-03-28 MED ORDER — LISINOPRIL 20 MG PO TABS
40.0000 mg | ORAL_TABLET | Freq: Every day | ORAL | Status: DC
  Administered 2019-03-28 – 2019-03-29 (×2): 40 mg via ORAL
  Filled 2019-03-28 (×2): qty 2

## 2019-03-28 MED ORDER — LACTATED RINGERS IV BOLUS: 250.0000 mL | Freq: Once | INTRAVENOUS | Status: DC

## 2019-03-28 MED ORDER — MEPIVACAINE HCL (PF) 2 % IJ SOLN
INTRAMUSCULAR | Status: DC | PRN
  Administered 2019-03-28: 3.5 mL via INTRATHECAL

## 2019-03-28 MED ORDER — PHENOL 1.4 % MT LIQD
1.0000 | OROMUCOSAL | Status: DC | PRN
  Filled 2019-03-28: qty 177

## 2019-03-28 MED ORDER — ONDANSETRON HCL 4 MG/2ML IJ SOLN
INTRAMUSCULAR | Status: DC | PRN
  Administered 2019-03-28: 4 mg via INTRAVENOUS

## 2019-03-28 MED ORDER — PHENYLEPHRINE HCL-NACL 10-0.9 MG/250ML-% IV SOLN
INTRAVENOUS | Status: DC | PRN
  Administered 2019-03-28: 25 ug/min via INTRAVENOUS

## 2019-03-28 MED ORDER — DOCUSATE SODIUM 100 MG PO CAPS
100.0000 mg | ORAL_CAPSULE | Freq: Two times a day (BID) | ORAL | Status: DC
  Administered 2019-03-28 – 2019-03-29 (×2): 100 mg via ORAL
  Filled 2019-03-28 (×2): qty 1

## 2019-03-28 MED ORDER — MEPIVACAINE HCL (PF) 2 % IJ SOLN
INTRAMUSCULAR | Status: AC
  Filled 2019-03-28: qty 20

## 2019-03-28 MED ORDER — CEFAZOLIN SODIUM-DEXTROSE 2-4 GM/100ML-% IV SOLN
2.0000 g | Freq: Four times a day (QID) | INTRAVENOUS | Status: AC
  Administered 2019-03-28 (×2): 2 g via INTRAVENOUS
  Filled 2019-03-28: qty 100

## 2019-03-28 MED ORDER — HYDROCODONE-ACETAMINOPHEN 7.5-325 MG PO TABS
1.0000 | ORAL_TABLET | ORAL | Status: DC | PRN
  Administered 2019-03-29: 2 via ORAL
  Filled 2019-03-28 (×2): qty 2

## 2019-03-28 MED ORDER — SODIUM CHLORIDE (PF) 0.9 % IJ SOLN
INTRAMUSCULAR | Status: AC
  Filled 2019-03-28: qty 50

## 2019-03-28 MED ORDER — 0.9 % SODIUM CHLORIDE (POUR BTL) OPTIME
TOPICAL | Status: DC | PRN
  Administered 2019-03-28: 15:00:00 1000 mL

## 2019-03-28 MED ORDER — BUPIVACAINE HCL (PF) 0.5 % IJ SOLN
INTRAMUSCULAR | Status: DC | PRN
  Administered 2019-03-28: 20 mL

## 2019-03-28 MED ORDER — ATORVASTATIN CALCIUM 40 MG PO TABS
40.0000 mg | ORAL_TABLET | Freq: Every day | ORAL | Status: DC
  Administered 2019-03-28: 40 mg via ORAL
  Filled 2019-03-28: qty 1

## 2019-03-28 MED ORDER — KETOROLAC TROMETHAMINE 15 MG/ML IJ SOLN
15.0000 mg | Freq: Four times a day (QID) | INTRAMUSCULAR | Status: DC
  Administered 2019-03-28 – 2019-03-29 (×3): 15 mg via INTRAVENOUS
  Filled 2019-03-28 (×4): qty 1

## 2019-03-28 MED ORDER — TRANEXAMIC ACID-NACL 1000-0.7 MG/100ML-% IV SOLN
1000.0000 mg | INTRAVENOUS | Status: AC
  Administered 2019-03-28: 15:00:00 1000 mg via INTRAVENOUS
  Filled 2019-03-28: qty 100

## 2019-03-28 MED ORDER — MIDAZOLAM HCL 2 MG/2ML IJ SOLN
INTRAMUSCULAR | Status: AC
  Filled 2019-03-28: qty 2

## 2019-03-28 MED ORDER — MIDAZOLAM HCL 2 MG/2ML IJ SOLN
1.0000 mg | Freq: Once | INTRAMUSCULAR | Status: AC
  Administered 2019-03-28: 2 mg via INTRAVENOUS
  Filled 2019-03-28: qty 2

## 2019-03-28 MED ORDER — KETOROLAC TROMETHAMINE 30 MG/ML IJ SOLN
INTRAMUSCULAR | Status: AC
  Administered 2019-03-28: 15 mg
  Filled 2019-03-28: qty 1

## 2019-03-28 MED ORDER — FENTANYL CITRATE (PF) 100 MCG/2ML IJ SOLN
INTRAMUSCULAR | Status: AC
  Filled 2019-03-28: qty 2

## 2019-03-28 MED ORDER — PROPOFOL 1000 MG/100ML IV EMUL
INTRAVENOUS | Status: AC
  Filled 2019-03-28: qty 200

## 2019-03-28 MED ORDER — FENTANYL CITRATE (PF) 100 MCG/2ML IJ SOLN
50.0000 ug | Freq: Once | INTRAMUSCULAR | Status: AC
  Administered 2019-03-28: 100 ug via INTRAVENOUS
  Filled 2019-03-28: qty 2

## 2019-03-28 MED ORDER — MEPERIDINE HCL 50 MG/ML IJ SOLN: 6.2500 mg | INTRAMUSCULAR | Status: DC | PRN

## 2019-03-28 MED ORDER — METHOCARBAMOL 500 MG IVPB - SIMPLE MED
500.0000 mg | Freq: Four times a day (QID) | INTRAVENOUS | Status: DC | PRN
  Administered 2019-03-28: 500 mg via INTRAVENOUS
  Filled 2019-03-28: qty 50

## 2019-03-28 MED ORDER — MIDAZOLAM HCL 5 MG/5ML IJ SOLN
INTRAMUSCULAR | Status: DC | PRN
  Administered 2019-03-28: 2 mg via INTRAVENOUS

## 2019-03-28 MED ORDER — ACETAMINOPHEN 325 MG PO TABS: 325.0000 mg | ORAL_TABLET | Freq: Four times a day (QID) | ORAL | Status: DC | PRN

## 2019-03-28 SURGICAL SUPPLY — 56 items
ATTUNE MED DOME PAT 41 KNEE (Knees) ×1 IMPLANT
ATTUNE MED DOME PAT 41MM KNEE (Knees) ×1 IMPLANT
ATTUNE PS FEM RT SZ 8 CEM KNEE (Femur) ×2 IMPLANT
ATTUNE PSRP INSR SZ8 5 KNEE (Insert) ×1 IMPLANT
ATTUNE PSRP INSR SZ8 5MM KNEE (Insert) ×1 IMPLANT
BAG DECANTER FOR FLEXI CONT (MISCELLANEOUS) ×3 IMPLANT
BAG SPEC THK2 15X12 ZIP CLS (MISCELLANEOUS) ×1
BAG ZIPLOCK 12X15 (MISCELLANEOUS) ×3 IMPLANT
BASE TIBIAL ROT PLAT SZ 7 KNEE (Knees) IMPLANT
BLADE SAGITTAL 25.0X1.19X90 (BLADE) ×2 IMPLANT
BLADE SAGITTAL 25.0X1.19X90MM (BLADE) ×1
BLADE SAW SGTL 11.0X1.19X90.0M (BLADE) ×3 IMPLANT
BNDG ELASTIC 6X5.8 VLCR STR LF (GAUZE/BANDAGES/DRESSINGS) ×3 IMPLANT
BOOTIES KNEE HIGH SLOAN (MISCELLANEOUS) ×3 IMPLANT
BOWL SMART MIX CTS (DISPOSABLE) ×3 IMPLANT
BSPLAT TIB 7 CMNT ROT PLAT STR (Knees) ×1 IMPLANT
CEMENT HV SMART SET (Cement) ×6 IMPLANT
COVER SURGICAL LIGHT HANDLE (MISCELLANEOUS) ×3 IMPLANT
COVER WAND RF STERILE (DRAPES) ×3 IMPLANT
CUFF TOURN SGL QUICK 34 (TOURNIQUET CUFF) ×3
CUFF TRNQT CYL 34X4.125X (TOURNIQUET CUFF) ×1 IMPLANT
DECANTER SPIKE VIAL GLASS SM (MISCELLANEOUS) ×6 IMPLANT
DRAPE SHEET LG 3/4 BI-LAMINATE (DRAPES) ×3 IMPLANT
DRAPE TOP 10253 STERILE (DRAPES) ×3 IMPLANT
DRAPE U-SHAPE 47X51 STRL (DRAPES) ×3 IMPLANT
DRESSING AQUACEL AG SP 3.5X10 (GAUZE/BANDAGES/DRESSINGS) IMPLANT
DRSG AQUACEL AG ADV 3.5X10 (GAUZE/BANDAGES/DRESSINGS) ×3 IMPLANT
DRSG AQUACEL AG SP 3.5X10 (GAUZE/BANDAGES/DRESSINGS) ×3
DURAPREP 26ML APPLICATOR (WOUND CARE) ×6 IMPLANT
ELECT REM PT RETURN 15FT ADLT (MISCELLANEOUS) ×3 IMPLANT
GLOVE BIO SURGEON STRL SZ8 (GLOVE) ×6 IMPLANT
GLOVE BIOGEL PI IND STRL 8 (GLOVE) ×2 IMPLANT
GLOVE BIOGEL PI INDICATOR 8 (GLOVE) ×4
GOWN STRL REUS W/TWL XL LVL3 (GOWN DISPOSABLE) ×6 IMPLANT
HANDPIECE INTERPULSE COAX TIP (DISPOSABLE) ×2
HOLDER FOLEY CATH W/STRAP (MISCELLANEOUS) ×2 IMPLANT
HOOD PEEL AWAY FLYTE STAYCOOL (MISCELLANEOUS) ×9 IMPLANT
KIT TURNOVER KIT A (KITS) IMPLANT
MANIFOLD NEPTUNE II (INSTRUMENTS) ×3 IMPLANT
NS IRRIG 1000ML POUR BTL (IV SOLUTION) ×3 IMPLANT
PACK TOTAL KNEE CUSTOM (KITS) ×3 IMPLANT
PAD ARMBOARD 7.5X6 YLW CONV (MISCELLANEOUS) ×3 IMPLANT
PENCIL SMOKE EVACUATOR (MISCELLANEOUS) ×2 IMPLANT
PIN DRILL FIX HALF THREAD (BIT) ×2 IMPLANT
PIN STEINMAN FIXATION KNEE (PIN) ×2 IMPLANT
PROTECTOR NERVE ULNAR (MISCELLANEOUS) ×3 IMPLANT
SET HNDPC FAN SPRY TIP SCT (DISPOSABLE) ×1 IMPLANT
SUT ETHIBOND NAB CT1 #1 30IN (SUTURE) ×6 IMPLANT
SUT VIC AB 0 CT1 36 (SUTURE) ×3 IMPLANT
SUT VIC AB 2-0 CT1 27 (SUTURE) ×2
SUT VIC AB 2-0 CT1 TAPERPNT 27 (SUTURE) ×1 IMPLANT
SUT VICRYL AB 3-0 FS1 BRD 27IN (SUTURE) ×3 IMPLANT
TIBIAL BASE ROT PLAT SZ 7 KNEE (Knees) ×3 IMPLANT
TRAY FOLEY MTR SLVR 16FR STAT (SET/KITS/TRAYS/PACK) ×2 IMPLANT
WATER STERILE IRR 1000ML POUR (IV SOLUTION) ×3 IMPLANT
WRAP KNEE MAXI GEL POST OP (GAUZE/BANDAGES/DRESSINGS) ×3 IMPLANT

## 2019-03-28 NOTE — Anesthesia Procedure Notes (Signed)
Date/Time: 03/28/2019 1:45 PM Performed by: Florene Route, CRNA Oxygen Delivery Method: Simple face mask

## 2019-03-28 NOTE — Anesthesia Procedure Notes (Signed)
Anesthesia Regional Block: Adductor canal block   Pre-Anesthetic Checklist: ,, timeout performed, Correct Patient, Correct Site, Correct Laterality, Correct Procedure, Correct Position, site marked, Risks and benefits discussed,  Surgical consent,  Pre-op evaluation,  At surgeon's request and post-op pain management  Laterality: Right  Prep: chloraprep       Needles:  Injection technique: Single-shot  Needle Type: Stimiplex     Needle Length: 9cm  Needle Gauge: 21     Additional Needles:   Procedures:,,,, ultrasound used (permanent image in chart),,,,  Narrative:  Start time: 03/28/2019 11:48 AM End time: 03/28/2019 11:53 AM Injection made incrementally with aspirations every 5 mL.  Performed by: Personally  Anesthesiologist: Lewie Loron, MD  Additional Notes: BP cuff, EKG monitors applied. Sedation begun. Artery and nerve location verified with U/S and anesthetic injected incrementally, slowly, and after negative aspirations under direct u/s guidance. Good fascial /perineural spread. Tolerated well.

## 2019-03-28 NOTE — Op Note (Signed)
PREOP DIAGNOSIS: DJD RIGHT KNEE POSTOP DIAGNOSIS: same PROCEDURE: RIGHT TKR ANESTHESIA: Spinal and MAC ATTENDING SURGEON: Hessie Dibble ASSISTANT: Loni Dolly PA  INDICATIONS FOR PROCEDURE: Jason Black is a 54 y.o. male who has struggled for a long time with pain due to degenerative arthritis of the right knee.  The patient has failed many conservative non-operative measures and at this point has pain which limits the ability to sleep and walk.  The patient is offered total knee replacement.  Informed operative consent was obtained after discussion of possible risks of anesthesia, infection, neurovascular injury, DVT, and death.  The importance of the post-operative rehabilitation protocol to optimize result was stressed extensively with the patient.  SUMMARY OF FINDINGS AND PROCEDURE:  Jason Black was taken to the operative suite where under the above anesthesia a right knee replacement was performed.  There were advanced degenerative changes and the bone quality was excellent.  He had extensive blood stained synovium. We used the DePuy Attune system and placed size 8 femur, 7 tibia, 41 mm all polyethylene patella, and a size 5 mm spacer.  Loni Dolly PA-C assisted throughout and was invaluable to the completion of the case in that he helped retract and maintain exposure while I placed components.  He also helped close thereby minimizing OR time.  The patient was admitted for appropriate post-op care to include perioperative antibiotics and mechanical and pharmacologic measures for DVT prophylaxis.  DESCRIPTION OF PROCEDURE:  Jason Black was taken to the operative suite where the above anesthesia was applied.  The patient was positioned supine and prepped and draped in normal sterile fashion.  An appropriate time out was performed.  After the administration of kefzol pre-op antibiotic the leg was elevated and exsanguinated and a tourniquet inflated. A standard longitudinal incision  was made on the anterior knee.  Dissection was carried down to the extensor mechanism.  All appropriate anti-infective measures were used including the pre-operative antibiotic, betadine impregnated drape, and closed hooded exhaust systems for each member of the surgical team.  A medial parapatellar incision was made in the extensor mechanism and the knee cap flipped and the knee flexed.  Some residual meniscal tissues were removed along with any remaining ACL/PCL tissue.  A guide was placed on the tibia and a flat cut was made on it's superior surface.  An intramedullary guide was placed in the femur and was utilized to make anterior and posterior cuts creating an appropriate flexion gap.  A second intramedullary guide was placed in the femur to make a distal cut properly balancing the knee with an extension gap equal to the flexion gap.  The three bones sized to the above mentioned sizes and the appropriate guides were placed and utilized.  A trial reduction was done and the knee easily came to full extension and the patella tracked well on flexion.  The trial components were removed and all bones were cleaned with pulsatile lavage and then dried thoroughly.  Cement was mixed and was pressurized onto the bones followed by placement of the aforementioned components.  Excess cement was trimmed and pressure was held on the components until the cement had hardened.  The tourniquet was deflated and a small amount of bleeding was controlled with cautery and pressure.  The knee was irrigated thoroughly.  The extensor mechanism was re-approximated with #1 ethibond in interrupted fashion.  The knee was flexed and the repair was solid.  The subcutaneous tissues were re-approximated with #0 and #2-0  vicryl and the skin closed with a subcuticular stitch and steristrips.  A sterile dressing was applied.  Intraoperative fluids, EBL, and tourniquet time can be obtained from anesthesia records.  DISPOSITION:  The patient was  taken to recovery room in stable condition and admitted for appropriate post-op care to include peri-operative antibiotic and DVT prophylaxis with mechanical and pharmacologic measures.  Hessie Dibble 03/28/2019, 3:33 PM

## 2019-03-28 NOTE — Evaluation (Signed)
Physical Therapy Evaluation Patient Details Name: Jason Black MRN: [id number] DOB: [birth year] Today's Date: 03/28/2019   History of Present Illness  Patient is 54 y.o. male s/p Rt TKA on 03/28/19 with PMH signficant for HTN, gout, HLD.  Clinical Impression  Jason Black is a 55 y.o. male POD 0 s/p Rt TKA. Patient reports modified independence with use of SPC for mobility at baseline. Patient is now limited by functional impairments (see PT problem list below) and requires min assist/guard for bed mob and transfers with RW. Patient was limited by dizziness in standing and unable to progress to ambulate today. He was slow to mobilize in bed and required cues and assist for Rt LE mobility. Patient instructed in exercises to facilitate ROM. Patient will benefit from continued skilled PT interventions to address impairments and progress towards PLOF. Acute PT will progress as able for safe discharge home.    Follow Up Recommendations Follow surgeon's recommendation for DC plan and follow-up therapies    Equipment Recommendations  Rolling walker with 5" wheels    Recommendations for Other Services       Precautions / Restrictions Precautions Precautions: Fall Restrictions Weight Bearing Restrictions: No      Mobility  Bed Mobility Overal bed mobility: Needs Assistance Bed Mobility: Supine to Sit;Sit to Supine     Supine to sit: Min assist;HOB elevated Sit to supine: Min assist;HOB elevated   General bed mobility comments: cues for use of gait belt for Rt LE mobility, assist required for LE mobilty  Transfers Overall transfer level: Needs assistance Equipment used: Rolling walker (2 wheeled) Transfers: Sit to/from Stand Sit to Stand: Min guard         General transfer comment: cues for safe hand placement required and min guard for safety; pt steady with rising and noted to have decreased weigth shift to Rt LE  Ambulation/Gait             General Gait Details:  NT pt c/o dizziness standing EOB  Stairs            Wheelchair Mobility    Modified Rankin (Stroke Patients Only)       Balance Overall balance assessment: Needs assistance Sitting-balance support: Feet supported Sitting balance-Leahy Scale: Good     Standing balance support: Bilateral upper extremity supported;During functional activity Standing balance-Leahy Scale: Poor                Pertinent Vitals/Pain Pain Assessment: 0-10 Pain Score: 4  Pain Location: Rt knee Pain Descriptors / Indicators: Aching;Sore Pain Intervention(s): Limited activity within patient's tolerance;Monitored during session    [place] expects to be discharged to:: Private residence Living Arrangements: Spouse/significant other Available Help at Discharge: Family;Available 24 hours/day(wife took week off) Type of Home: House Home Access: Stairs to enter Entrance Stairs-Rails: None Entrance Stairs-Number of Steps: 3 Home Layout: Two level;Bed/bath upstairs;1/2 bath on main level(bed downstairs) Home Equipment: Cane - single point;Shower seat - built in;Grab bars - tub/shower      Prior Function Level of Independence: Independent with assistive device(s)         Comments: pt using SPC to mobilize due to pain     Hand Dominance   Dominant Hand: Right    Extremity/Trunk Assessment   Upper Extremity Assessment Upper Extremity Assessment: Overall WFL for tasks assessed    Lower Extremity Assessment Lower Extremity Assessment: RLE deficits/detail RLE Deficits / Details: poor quad activation, extensor lag noted with SLR RLE Sensation: WNL RLE  Coordination: WNL    Cervical / Trunk Assessment Cervical / Trunk Assessment: Normal  Communication   Communication: No difficulties  Cognition Arousal/Alertness: Awake/alert Behavior During Therapy: WFL for tasks assessed/performed Overall Cognitive Status: Within Functional Limits for tasks assessed            General Comments      Exercises Total Joint Exercises Quad Sets: AROM;Supine;Right;10 reps Heel Slides: AROM;AAROM;10 reps;Supine   Assessment/Plan    PT Assessment Patient needs continued PT services  PT Problem List Decreased strength;Decreased balance;Decreased mobility;Decreased range of motion;Decreased activity tolerance;Decreased knowledge of use of DME       PT Treatment Interventions DME instruction;Functional mobility training;Balance training;Patient/family education;Therapeutic activities;Gait training;Stair training;Therapeutic exercise    PT Goals (Current goals can be found in the Care Plan section)  Acute Rehab PT Goals Patient Stated Goal: to get more independent PT Goal Formulation: With patient Time For Goal Achievement: 04/04/19 Potential to Achieve Goals: Good    Frequency 7X/week    AM-PAC PT "6 Clicks" Mobility  Outcome Measure Help needed turning from your back to your side while in a flat bed without using bedrails?: A Little Help needed moving from lying on your back to sitting on the side of a flat bed without using bedrails?: A Little Help needed moving to and from a bed to a chair (including a wheelchair)?: A Little Help needed standing up from a chair using your arms (e.g., wheelchair or bedside chair)?: A Little Help needed to walk in hospital room?: A Little Help needed climbing 3-5 steps with a railing? : A Little 6 Click Score: 18    End of Session Equipment Utilized During Treatment: Gait belt Activity Tolerance: Patient tolerated treatment well Patient left: in bed;with call bell/phone within reach Nurse Communication: Mobility status PT Visit Diagnosis: Muscle weakness (generalized) (M62.81);Difficulty in walking, not elsewhere classified (R26.2)    Time: 1810-1907(10 minutes not billiable, pt requesting to use urinal in the middle of session) PT Time Calculation (min) (ACUTE ONLY): 57 min   Charges:   PT Evaluation $PT Eval  Low Complexity: 1 Low PT Treatments $Therapeutic Exercise: 8-22 mins $Therapeutic Activity: 8-22 mins       Wynn Maudlin, DPT Physical Therapist with [facility] [phone]  03/28/2019 7:27 PM

## 2019-03-28 NOTE — Anesthesia Preprocedure Evaluation (Signed)
Anesthesia Evaluation  Patient identified by MRN, date of birth, ID band Patient awake    Reviewed: Allergy & Precautions, NPO status , Patient's Chart, lab work & pertinent test results  Airway Mallampati: II  TM Distance: >3 FB Neck ROM: Full    Dental  (+) Dental Advisory Given   Pulmonary Current Smoker,    Pulmonary exam normal breath sounds clear to auscultation       Cardiovascular hypertension, Pt. on medications Normal cardiovascular exam Rhythm:Regular Rate:Normal     Neuro/Psych negative neurological ROS  negative psych ROS   GI/Hepatic negative GI ROS, Neg liver ROS,   Endo/Other  negative endocrine ROS  Renal/GU negative Renal ROS     Musculoskeletal negative musculoskeletal ROS (+)   Abdominal (+) + obese,   Peds  Hematology negative hematology ROS (+)   Anesthesia Other Findings   Reproductive/Obstetrics                             Anesthesia Physical Anesthesia Plan  ASA: III  Anesthesia Plan:    Post-op Pain Management:    Induction: Intravenous  PONV Risk Score and Plan: 2 and Ondansetron, Dexamethasone, Treatment may vary due to age or medical condition and Midazolam  Airway Management Planned:   Additional Equipment:   Intra-op Plan:   Post-operative Plan:   Informed Consent: I have reviewed the patients History and Physical, chart, labs and discussed the procedure including the risks, benefits and alternatives for the proposed anesthesia with the patient or authorized representative who has indicated his/her understanding and acceptance.     Dental advisory given  Plan Discussed with: CRNA  Anesthesia Plan Comments:         Anesthesia Quick Evaluation

## 2019-03-28 NOTE — Interval H&P Note (Signed)
History and Physical Interval Note:  03/28/2019 11:38 AM  Jason Black  has presented today for surgery, with the diagnosis of RIGHT KNEE DEGENERATIVE JOINT DISEASE.  The various methods of treatment have been discussed with the patient and family. After consideration of risks, benefits and other options for treatment, the patient has consented to  Procedure(s): RIGHT TOTAL KNEE ARTHROPLASTY (Right) as a surgical intervention.  The patient's history has been reviewed, patient examined, no change in status, stable for surgery.  I have reviewed the patient's chart and labs.  Questions were answered to the patient's satisfaction.     Velna Ochs

## 2019-03-28 NOTE — Anesthesia Procedure Notes (Signed)
Spinal  Patient location during procedure: OR Staffing Anesthesiologist: Darius Fillingim, MD Preanesthetic Checklist Completed: patient identified, IV checked, site marked, risks and benefits discussed, surgical consent, monitors and equipment checked, pre-op evaluation and timeout performed Spinal Block Patient position: sitting Prep: DuraPrep Patient monitoring: heart rate, cardiac monitor, continuous pulse ox and blood pressure Approach: right paramedian Location: L3-4 Injection technique: single-shot Needle Needle type: Sprotte  Needle gauge: 24 G Needle length: 9 cm Assessment Sensory level: T4     

## 2019-03-28 NOTE — Transfer of Care (Signed)
Immediate Anesthesia Transfer of Care Note  Patient: Jason Black  Procedure(s) Performed: RIGHT TOTAL KNEE ARTHROPLASTY (Right Knee)  Patient Location: PACU  Anesthesia Type:MAC combined with regional for post-op pain  Level of Consciousness: awake, oriented and patient cooperative  Airway & Oxygen Therapy: Patient Spontanous Breathing and Patient connected to face mask oxygen  Post-op Assessment: Report given to RN and Post -op Vital signs reviewed and stable  Post vital signs: Reviewed and stable  Last Vitals:  Vitals Value Taken Time  BP 110/65 03/28/19 1601  Temp    Pulse 57 03/28/19 1603  Resp 17 03/28/19 1603  SpO2 99 % 03/28/19 1603  Vitals shown include unvalidated device data.  Last Pain:  Vitals:   03/28/19 1048  TempSrc:   PainSc: 4          Complications: No apparent anesthesia complications

## 2019-03-28 NOTE — Anesthesia Postprocedure Evaluation (Signed)
Anesthesia Post Note  Patient: Jason Black  Procedure(s) Performed: RIGHT TOTAL KNEE ARTHROPLASTY (Right Knee)     Patient location during evaluation: PACU Anesthesia Type: Spinal Level of consciousness: awake and alert Pain management: pain level controlled Vital Signs Assessment: post-procedure vital signs reviewed and stable Respiratory status: spontaneous breathing and respiratory function stable Cardiovascular status: blood pressure returned to baseline and stable Postop Assessment: spinal receding Anesthetic complications: no    Last Vitals:  Vitals:   03/28/19 1730 03/28/19 1800  BP: (!) 151/100 (!) 171/93  Pulse: 79 (!) 56  Resp: 12 12  Temp:    SpO2: 100% 100%    Last Pain:  Vitals:   03/28/19 1800  TempSrc:   PainSc: 5                  Lewie Loron

## 2019-03-29 ENCOUNTER — Other Ambulatory Visit: Payer: Self-pay

## 2019-03-29 ENCOUNTER — Encounter: Payer: Self-pay | Admitting: *Deleted

## 2019-03-29 DIAGNOSIS — M1711 Unilateral primary osteoarthritis, right knee: Secondary | ICD-10-CM | POA: Diagnosis not present

## 2019-03-29 NOTE — Progress Notes (Signed)
Subjective: 1 Day Post-Op Procedure(s) (LRB): RIGHT TOTAL KNEE ARTHROPLASTY (Right)   Patient feeling better and hoping to go home this morning.  Activity level:  wbat Diet tolerance:  ok Voiding:  ok Patient reports pain as mild.    Objective: Vital signs in last 24 hours: Temp:  [97.3 F (36.3 C)-98.4 F (36.9 C)] 98 F (36.7 C) (01/20 0255) Pulse Rate:  [56-99] 72 (01/20 0255) Resp:  [12-24] 18 (01/20 0255) BP: (110-171)/(65-100) 125/71 (01/20 0255) SpO2:  [96 %-100 %] 99 % (01/20 0255) Weight:  [024 kg] 107 kg (01/20 0056)  Labs: No results for input(s): HGB in the last 72 hours. No results for input(s): WBC, RBC, HCT, PLT in the last 72 hours. No results for input(s): NA, K, CL, CO2, BUN, CREATININE, GLUCOSE, CALCIUM in the last 72 hours. No results for input(s): LABPT, INR in the last 72 hours.  Physical Exam:  Neurologically intact ABD soft Neurovascular intact Sensation intact distally Intact pulses distally Dorsiflexion/Plantar flexion intact Incision: dressing C/D/I and scant drainage No cellulitis present Compartment soft  Assessment/Plan:  1 Day Post-Op Procedure(s) (LRB): RIGHT TOTAL KNEE ARTHROPLASTY (Right) Advance diet Up with therapy D/C IV fluids Discharge home with home health today after PT. I changed to a clean new bandage and there was no active drainage.  Continue on asa81mg  BID x 4 weeks post op. Follow up in office 2 weeks post op.    Ginger Organ [place]ow [place] 03/29/2019, 8:13 AM

## 2019-03-29 NOTE — Discharge Summary (Signed)
Patient ID: Jason Black MRN: [id number] DOB/AGE: [birth year] 54 y.o.  Admit date: 03/28/2019 Discharge date: 03/29/2019  Admission Diagnoses:  Principal Problem:   Primary osteoarthritis of right knee   Discharge Diagnoses:  Same  Past Medical History:  Diagnosis Date  . Cocaine abuse (HCC)   . Gout   . High cholesterol   . Hypertension     Surgeries: Procedure(s): RIGHT TOTAL KNEE ARTHROPLASTY on 03/28/2019   Consultants:   Discharged Condition: Improved  Hospital Course: RAHEEL KUNKLE is an 54 y.o. male who was admitted 03/28/2019 for operative treatment ofPrimary osteoarthritis of right knee. Patient has severe unremitting pain that affects sleep, daily activities, and work/hobbies. After pre-op clearance the patient was taken to the operating room on 03/28/2019 and underwent  Procedure(s): RIGHT TOTAL KNEE ARTHROPLASTY.    Patient was given perioperative antibiotics:  Anti-infectives (From admission, onward)   Start     Dose/Rate Route Frequency Ordered Stop   03/28/19 1730  ceFAZolin (ANCEF) IVPB 2g/100 mL premix     2 g 200 mL/hr over 30 Minutes Intravenous Every 6 hours 03/28/19 1621 03/28/19 2303   03/28/19 1722  ceFAZolin (ANCEF) 2-4 GM/100ML-% IVPB    Note to Pharmacy: Mauro Kaufmann   : cabinet override      03/28/19 1722 03/29/19 0529   03/28/19 1015  ceFAZolin (ANCEF) IVPB 2g/100 mL premix     2 g 200 mL/hr over 30 Minutes Intravenous On call to O.R. 03/28/19 1013 03/28/19 1409       Patient was given sequential compression devices, early ambulation, and chemoprophylaxis to prevent DVT.  Patient benefited maximally from hospital stay and there were no complications.    Recent vital signs:  Patient Vitals for the past 24 hrs:  BP Temp Temp src Pulse Resp SpO2 Height Weight  03/29/19 0255 125/71 98 F (36.7 C) -- 72 18 99 % -- --  03/29/19 0056 138/76 98.4 F (36.9 C) Oral 75 16 99 % 5\' 11"  (1.803 m) 107 kg  03/28/19 2353 (!) 155/84 98 F  (36.7 C) Oral (!) 57 16 98 % -- --  03/28/19 2247 140/89 (!) 97.3 F (36.3 C) Oral 67 16 100 % -- --  03/28/19 2136 (!) 158/82 97.9 F (36.6 C) Oral 69 16 96 % -- --  03/28/19 2115 (!) 151/86 97.9 F (36.6 C) -- 71 -- 100 % -- --  03/28/19 2045 (!) 148/89 -- -- 77 -- 100 % -- --  03/28/19 2030 (!) 151/73 -- -- 77 -- 100 % -- --  03/28/19 2000 (!) 145/66 -- -- 68 14 100 % -- --  03/28/19 1800 (!) 171/93 -- -- (!) 56 12 100 % -- --  03/28/19 1730 (!) 151/100 -- -- 79 12 100 % -- --  03/28/19 1630 130/81 -- -- 61 17 100 % -- --  03/28/19 1600 110/65 97.7 F (36.5 C) -- 63 17 99 % -- --  03/28/19 1202 -- -- -- (!) 57 17 98 % -- --  03/28/19 1201 -- -- -- 80 17 99 % -- --  03/28/19 1200 -- -- -- 79 16 99 % -- --  03/28/19 1159 -- -- -- 75 20 99 % -- --  03/28/19 1158 -- -- -- 81 19 99 % -- --  03/28/19 1157 -- -- -- 78 (!) 24 98 % -- --  03/28/19 1156 -- -- -- 76 (!) 22 97 % -- --  03/28/19 1155 -- -- -- 73 12 100 % -- --  03/28/19 1154 131/80 -- -- 84 18 100 % -- --  03/28/19 1153 -- -- -- 77 16 100 % -- --  03/28/19 1152 -- -- -- 83 13 100 % -- --  03/28/19 1151 -- -- -- 73 17 100 % -- --  03/28/19 1150 -- -- -- 77 (!) 24 99 % -- --  03/28/19 1149 (!) 141/89 -- -- 75 (!) 22 100 % -- --  03/28/19 1018 139/82 98.2 F (36.8 C) Oral 99 18 100 % -- --     Recent laboratory studies: No results for input(s): WBC, HGB, HCT, PLT, NA, K, CL, CO2, BUN, CREATININE, GLUCOSE, INR, CALCIUM in the last 72 hours.  Invalid input(s): PT, 2   Discharge Medications:   Allergies as of 03/29/2019   No Known Allergies     Medication List    STOP taking these medications   meloxicam 15 MG tablet Commonly known as: MOBIC     TAKE these medications   allopurinol 300 MG tablet Commonly known as: ZYLOPRIM Take 300 mg by mouth daily.   aspirin EC 81 MG tablet Take 1 tablet (81 mg total) by mouth 2 (two) times daily after a meal. What changed: when to take this   atorvastatin 40 MG  tablet Commonly known as: LIPITOR Take 40 mg by mouth at bedtime.   hydrALAZINE 100 MG tablet Commonly known as: APRESOLINE Take 100 mg by mouth 2 (two) times daily.   HYDROcodone-acetaminophen 5-325 MG tablet Commonly known as: NORCO/VICODIN Take 1-2 tablets by mouth every 6 (six) hours as needed for moderate pain or severe pain (post op pain).   isosorbide dinitrate 30 MG tablet Commonly known as: ISORDIL Take 30 mg by mouth daily.   lisinopril 40 MG tablet Commonly known as: ZESTRIL Take 40 mg by mouth daily.   Mitigare 0.6 MG Caps Generic drug: Colchicine Take 0.6-1.2 mg by mouth See admin instructions. Take 1.2 mg at first sign of gout then take 0.6 mg 1 hour later then take 0.6 mg once daily until gout pain subsides   multivitamin with minerals Tabs tablet Take 1 tablet by mouth daily.   TIGER BALM MUSCLE RUB EX Apply 1 application topically at bedtime.   tiZANidine 4 MG tablet Commonly known as: Zanaflex Take 1 tablet (4 mg total) by mouth every 6 (six) hours as needed for muscle spasms.            Durable Medical Equipment  (From admission, onward)         Start     Ordered   03/28/19 2138  DME Walker rolling  Once    Question:  Patient needs a walker to treat with the following condition  Answer:  Primary osteoarthritis of right knee   03/28/19 2138   03/28/19 2138  DME 3 n 1  Once     03/28/19 2138   03/28/19 2138  DME Bedside commode  Once    Question:  Patient needs a bedside commode to treat with the following condition  Answer:  Primary osteoarthritis of right knee   03/28/19 2138          Diagnostic Studies: DG Chest 2 View  Result Date: 03/24/2019 CLINICAL DATA:  54 year old male under preoperative evaluation for knee replacement. EXAM: CHEST - 2 VIEW COMPARISON:  Chest x-ray 07/25/2016. FINDINGS: Lung volumes are normal. No consolidative airspace disease. No pleural effusions. No pneumothorax. No pulmonary nodule or mass noted. Pulmonary  vasculature and the cardiomediastinal silhouette are within  normal limits. IMPRESSION: No radiographic evidence of acute cardiopulmonary disease. Electronically Signed   By: Trudie Reed M.D.   On: 03/24/2019 16:59    Disposition: Discharge disposition: 01-Home or Self Care       Discharge Instructions    Call MD / Call 911   Complete by: As directed    If you experience chest pain or shortness of breath, CALL 911 and be transported to the hospital emergency room.  If you develope a fever above 101 F, pus (white drainage) or increased drainage or redness at the wound, or calf pain, call your surgeon's office.   Call MD / Call 911   Complete by: As directed    If you experience chest pain or shortness of breath, CALL 911 and be transported to the hospital emergency room.  If you develope a fever above 101 F, pus (white drainage) or increased drainage or redness at the wound, or calf pain, call your surgeon's office.   Constipation Prevention   Complete by: As directed    Drink plenty of fluids.  Prune juice may be helpful.  You may use a stool softener, such as Colace (over the counter) 100 mg twice a day.  Use MiraLax (over the counter) for constipation as needed.   Constipation Prevention   Complete by: As directed    Drink plenty of fluids.  Prune juice may be helpful.  You may use a stool softener, such as Colace (over the counter) 100 mg twice a day.  Use MiraLax (over the counter) for constipation as needed.   Diet - low sodium heart healthy   Complete by: As directed    Diet - low sodium heart healthy   Complete by: As directed    Discharge instructions   Complete by: As directed    INSTRUCTIONS AFTER JOINT REPLACEMENT   Remove items at home which could result in a fall. This includes throw rugs or furniture in walking pathways ICE to the affected joint every three hours while awake for 30 minutes at a time, for at least the first 3-5 days, and then as needed for pain and  swelling.  Continue to use ice for pain and swelling. You may notice swelling that will progress down to the foot and ankle.  This is normal after surgery.  Elevate your leg when you are not up walking on it.   Continue to use the breathing machine you got in the hospital (incentive spirometer) which will help keep your temperature down.  It is common for your temperature to cycle up and down following surgery, especially at night when you are not up moving around and exerting yourself.  The breathing machine keeps your lungs expanded and your temperature down.   DIET:  As you were doing prior to hospitalization, we recommend a well-balanced diet.  DRESSING / WOUND CARE / SHOWERING  You may shower 3 days after surgery, but keep the wounds dry during showering.  You may use an occlusive plastic wrap (Press'n Seal for example), NO SOAKING/SUBMERGING IN THE BATHTUB.  If the bandage gets wet, change with a clean dry gauze.  If the incision gets wet, pat the wound dry with a clean towel.  ACTIVITY  Increase activity slowly as tolerated, but follow the weight bearing instructions below.   No driving for 6 weeks or until further direction given by your physician.  You cannot drive while taking narcotics.  No lifting or carrying greater than 10 lbs. until further directed  by your surgeon. Avoid periods of inactivity such as sitting longer than an hour when not asleep. This helps prevent blood clots.  You may return to work once you are authorized by your doctor.     WEIGHT BEARING   Weight bearing as tolerated with assist device (walker, cane, etc) as directed, use it as long as suggested by your surgeon or therapist, typically at least 4-6 weeks.   EXERCISES  Results after joint replacement surgery are often greatly improved when you follow the exercise, range of motion and muscle strengthening exercises prescribed by your doctor. Safety measures are also important to protect the joint from  further injury. Any time any of these exercises cause you to have increased pain or swelling, decrease what you are doing until you are comfortable again and then slowly increase them. If you have problems or questions, call your caregiver or physical therapist for advice.   Rehabilitation is important following a joint replacement. After just a few days of immobilization, the muscles of the leg can become weakened and shrink (atrophy).  These exercises are designed to build up the tone and strength of the thigh and leg muscles and to improve motion. Often times heat used for twenty to thirty minutes before working out will loosen up your tissues and help with improving the range of motion but do not use heat for the first two weeks following surgery (sometimes heat can increase post-operative swelling).   These exercises can be done on a training (exercise) mat, on the floor, on a table or on a bed. Use whatever works the best and is most comfortable for you.    Use music or television while you are exercising so that the exercises are a pleasant break in your day. This will make your life better with the exercises acting as a break in your routine that you can look forward to.   Perform all exercises about fifteen times, three times per day or as directed.  You should exercise both the operative leg and the other leg as well.   Exercises include:   Quad Sets - Tighten up the muscle on the front of the thigh (Quad) and hold for 5-10 seconds.   Straight Leg Raises - With your knee straight (if you were given a brace, keep it on), lift the leg to 60 degrees, hold for 3 seconds, and slowly lower the leg.  Perform this exercise against resistance later as your leg gets stronger.  Leg Slides: Lying on your back, slowly slide your foot toward your buttocks, bending your knee up off the floor (only go as far as is comfortable). Then slowly slide your foot back down until your leg is flat on the floor again.   Angel Wings: Lying on your back spread your legs to the side as far apart as you can without causing discomfort.  Hamstring Strength:  Lying on your back, push your heel against the floor with your leg straight by tightening up the muscles of your buttocks.  Repeat, but this time bend your knee to a comfortable angle, and push your heel against the floor.  You may put a pillow under the heel to make it more comfortable if necessary.   A rehabilitation program following joint replacement surgery can speed recovery and prevent re-injury in the future due to weakened muscles. Contact your doctor or a physical therapist for more information on knee rehabilitation.    CONSTIPATION  Constipation is defined medically as fewer than  three stools per week and severe constipation as less than one stool per week.  Even if you have a regular bowel pattern at home, your normal regimen is likely to be disrupted due to multiple reasons following surgery.  Combination of anesthesia, postoperative narcotics, change in appetite and fluid intake all can affect your bowels.   YOU MUST use at least one of the following options; they are listed in order of increasing strength to get the job done.  They are all available over the counter, and you may need to use some, POSSIBLY even all of these options:    Drink plenty of fluids (prune juice may be helpful) and high fiber foods Colace 100 mg by mouth twice a day  Senokot for constipation as directed and as needed Dulcolax (bisacodyl), take with full glass of water  Miralax (polyethylene glycol) once or twice a day as needed.  If you have tried all these things and are unable to have a bowel movement in the first 3-4 days after surgery call either your surgeon or your primary doctor.    If you experience loose stools or diarrhea, hold the medications until you stool forms back up.  If your symptoms do not get better within 1 week or if they get worse, check with your  doctor.  If you experience "the worst abdominal pain ever" or develop nausea or vomiting, please contact the office immediately for further recommendations for treatment.   ITCHING:  If you experience itching with your medications, try taking only a single pain pill, or even half a pain pill at a time.  You can also use Benadryl over the counter for itching or also to help with sleep.   TED HOSE STOCKINGS:  Use stockings on both legs until for at least 2 weeks or as directed by physician office. They may be removed at night for sleeping.  MEDICATIONS:  See your medication summary on the "After Visit Summary" that nursing will review with you.  You may have some home medications which will be placed on hold until you complete the course of blood thinner medication.  It is important for you to complete the blood thinner medication as prescribed.  PRECAUTIONS:  If you experience chest pain or shortness of breath - call 911 immediately for transfer to the hospital emergency department.   If you develop a fever greater that 101 F, purulent drainage from wound, increased redness or drainage from wound, foul odor from the wound/dressing, or calf pain - CONTACT YOUR SURGEON.                                                   FOLLOW-UP APPOINTMENTS:  If you do not already have a post-op appointment, please call the office for an appointment to be seen by your surgeon.  Guidelines for how soon to be seen are listed in your "After Visit Summary", but are typically between 1-4 weeks after surgery.  OTHER INSTRUCTIONS:   Knee Replacement:  Do not place pillow under knee, focus on keeping the knee straight while resting. CPM instructions: 0-90 degrees, 2 hours in the morning, 2 hours in the afternoon, and 2 hours in the evening. Place foam block, curve side up under heel at all times except when in CPM or when walking.  DO NOT modify, tear, cut, or change  the foam block in any way.  MAKE SURE YOU:  Understand  these instructions.  Get help right away if you are not doing well or get worse.    Thank you for letting us be a part of your medical care team.  It is a privilege we respect greatly.  We hope these instructions will help you stay on track for a fast and full recovery!   Discharge instructions   Complete by: As directed    INSTRUCTIONS AFTER JOINT REPLACEMENT   Remove items at home which could result in a fall. This includes throw rugs or furniture in walking pathways ICE to the affected joint every three hours while awake for 30 minutes at a time, for at least the first 3-5 days, and then as needed for pain and swelling.  Continue to use ice for pain and swelling. You may notice swelling that will progress down to the foot and ankle.  This is normal after surgery.  Elevate your leg when you are not up walking on it.   Continue to use the breathing machine you got in the hospital (incentive spirometer) which will help keep your temperature down.  It is common for your temperature to cycle up and down following surgery, especially at night when you are not up moving around and exerting yourself.  The breathing machine keeps your lungs expanded and your temperature down.   DIET:  As you were doing prior to hospitalization, we recommend a well-balanced diet.  DRESSING / WOUND CARE / SHOWERING  You may shower 3 days after surgery, but keep the wounds dry during showering.  You may use an occlusive plastic wrap (Press'n Seal for example), NO SOAKING/SUBMERGING IN THE BATHTUB.  If the bandage gets wet, change with a clean dry gauze.  If the incision gets wet, pat the wound dry with a clean towel.  ACTIVITY  Increase activity slowly as tolerated, but follow the weight bearing instructions below.   No driving for 6 weeks or until further direction given by your physician.  You cannot drive while taking narcotics.  No lifting or carrying greater than 10 lbs. until further directed by your  surgeon. Avoid periods of inactivity such as sitting longer than an hour when not asleep. This helps prevent blood clots.  You may return to work once you are authorized by your doctor.     WEIGHT BEARING   Weight bearing as tolerated with assist device (walker, cane, etc) as directed, use it as long as suggested by your surgeon or therapist, typically at least 4-6 weeks.   EXERCISES  Results after joint replacement surgery are often greatly improved when you follow the exercise, range of motion and muscle strengthening exercises prescribed by your doctor. Safety measures are also important to protect the joint from further injury. Any time any of these exercises cause you to have increased pain or swelling, decrease what you are doing until you are comfortable again and then slowly increase them. If you have problems or questions, call your caregiver or physical therapist for advice.   Rehabilitation is important following a joint replacement. After just a few days of immobilization, the muscles of the leg can become weakened and shrink (atrophy).  These exercises are designed to build up the tone and strength of the thigh and leg muscles and to improve motion. Often times heat used for twenty to thirty minutes before working out will loosen up your tissues and help with improving the range of motion  but do not use heat for the first two weeks following surgery (sometimes heat can increase post-operative swelling).   These exercises can be done on a training (exercise) mat, on the floor, on a table or on a bed. Use whatever works the best and is most comfortable for you.    Use music or television while you are exercising so that the exercises are a pleasant break in your day. This will make your life better with the exercises acting as a break in your routine that you can look forward to.   Perform all exercises about fifteen times, three times per day or as directed.  You should exercise both the  operative leg and the other leg as well.   Exercises include:   Quad Sets - Tighten up the muscle on the front of the thigh (Quad) and hold for 5-10 seconds.   Straight Leg Raises - With your knee straight (if you were given a brace, keep it on), lift the leg to 60 degrees, hold for 3 seconds, and slowly lower the leg.  Perform this exercise against resistance later as your leg gets stronger.  Leg Slides: Lying on your back, slowly slide your foot toward your buttocks, bending your knee up off the floor (only go as far as is comfortable). Then slowly slide your foot back down until your leg is flat on the floor again.  Angel Wings: Lying on your back spread your legs to the side as far apart as you can without causing discomfort.  Hamstring Strength:  Lying on your back, push your heel against the floor with your leg straight by tightening up the muscles of your buttocks.  Repeat, but this time bend your knee to a comfortable angle, and push your heel against the floor.  You may put a pillow under the heel to make it more comfortable if necessary.   A rehabilitation program following joint replacement surgery can speed recovery and prevent re-injury in the future due to weakened muscles. Contact your doctor or a physical therapist for more information on knee rehabilitation.    CONSTIPATION  Constipation is defined medically as fewer than three stools per week and severe constipation as less than one stool per week.  Even if you have a regular bowel pattern at home, your normal regimen is likely to be disrupted due to multiple reasons following surgery.  Combination of anesthesia, postoperative narcotics, change in appetite and fluid intake all can affect your bowels.   YOU MUST use at least one of the following options; they are listed in order of increasing strength to get the job done.  They are all available over the counter, and you may need to use some, POSSIBLY even all of these options:     Drink plenty of fluids (prune juice may be helpful) and high fiber foods Colace 100 mg by mouth twice a day  Senokot for constipation as directed and as needed Dulcolax (bisacodyl), take with full glass of water  Miralax (polyethylene glycol) once or twice a day as needed.  If you have tried all these things and are unable to have a bowel movement in the first 3-4 days after surgery call either your surgeon or your primary doctor.    If you experience loose stools or diarrhea, hold the medications until you stool forms back up.  If your symptoms do not get better within 1 week or if they get worse, check with your doctor.  If you experience "the  worst abdominal pain ever" or develop nausea or vomiting, please contact the office immediately for further recommendations for treatment.   ITCHING:  If you experience itching with your medications, try taking only a single pain pill, or even half a pain pill at a time.  You can also use Benadryl over the counter for itching or also to help with sleep.   TED HOSE STOCKINGS:  Use stockings on both legs until for at least 2 weeks or as directed by physician office. They may be removed at night for sleeping.  MEDICATIONS:  See your medication summary on the "After Visit Summary" that nursing will review with you.  You may have some home medications which will be placed on hold until you complete the course of blood thinner medication.  It is important for you to complete the blood thinner medication as prescribed.  PRECAUTIONS:  If you experience chest pain or shortness of breath - call 911 immediately for transfer to the hospital emergency department.   If you develop a fever greater that 101 F, purulent drainage from wound, increased redness or drainage from wound, foul odor from the wound/dressing, or calf pain - CONTACT YOUR SURGEON.                                                   FOLLOW-UP APPOINTMENTS:  If you do not already have a post-op  appointment, please call the office for an appointment to be seen by your surgeon.  Guidelines for how soon to be seen are listed in your "After Visit Summary", but are typically between 1-4 weeks after surgery.  OTHER INSTRUCTIONS:   Knee Replacement:  Do not place pillow under knee, focus on keeping the knee straight while resting. CPM instructions: 0-90 degrees, 2 hours in the morning, 2 hours in the afternoon, and 2 hours in the evening. Place foam block, curve side up under heel at all times except when in CPM or when walking.  DO NOT modify, tear, cut, or change the foam block in any way.  MAKE SURE YOU:  Understand these instructions.  Get help right away if you are not doing well or get worse.    Thank you for letting us be a part of your medical care team.  It is a privilege we respect greatly.  We hope these instructions will help you stay on track for a fast and full recovery!   Increase activity slowly as tolerated   Complete by: As directed    Increase activity slowly as tolerated   Complete by: As directed       Follow-up Information    Marcene Corningalldorf, Peter, MD. Go on 04/07/2019.   Specialty: Orthopedic Surgery Why: Your appointment is scheduled for 11:00  Contact information: [address] [place] [address] [phone]        [facility] Follow up.   Specialty: Home Health Services Why: You will be seen at home by HHPT prior to starting outpatient physical therapy  Contact information: [address] [place] [address] [phone]        [facility] [place] Go on 04/07/2019.   Why: You are scheduled to start outpatient physical therapy after your MD appointment. Please go over the the PT side and complete your paperwork after you see Dr. Jeanie Cooksalldorf  Contact information:  Physical Therapy [address] [place] [address] [phone]            Signed: Ginger Organndrew Paul Adria Costley 03/29/2019, 8:15 AM

## 2019-03-29 NOTE — Progress Notes (Signed)
Physical Therapy Treatment Patient Details Name: Jason Black MRN: [id number] DOB: [birth year] Today's Date: 03/29/2019    History of Present Illness Patient is 54 y.o. male s/p Rt TKA on 03/28/19 with PMH signficant for HTN, gout, HLD.    PT Comments    Pt is progressing well with mobility and is ready to DC home from PT standpoint. He ambulated 200' with RW, completed stair training, and demonstrates good understanding of HEP.    Follow Up Recommendations  Follow surgeon's recommendation for DC plan and follow-up therapies     Equipment Recommendations  Rolling walker with 5" wheels    Recommendations for Other Services       Precautions / Restrictions Precautions Precautions: Fall;Knee Precaution Booklet Issued: Yes (comment) Precaution Comments: reviewed no pillow under knee Restrictions Weight Bearing Restrictions: No    Mobility  Bed Mobility Overal bed mobility: Modified Independent Bed Mobility: Supine to Sit     Supine to sit: HOB elevated;Modified independent (Device/Increase time)     General bed mobility comments: HOB up, used rail  Transfers Overall transfer level: Needs assistance Equipment used: Rolling walker (2 wheeled) Transfers: Sit to/from Stand Sit to Stand: Supervision         General transfer comment: cues for safe hand placement  Ambulation/Gait Ambulation/Gait assistance: Supervision Gait Distance (Feet): 200 Feet Assistive device: Rolling walker (2 wheeled) Gait Pattern/deviations: Step-through pattern;Decreased stride length Gait velocity: decr   General Gait Details: no dizziness, no loss of balance, VCs sequencing   Stairs Stairs: Yes Stairs assistance: Supervision Stair Management: No rails;Backwards Number of Stairs: 1 General stair comments: VCs sequencing   Wheelchair Mobility    Modified Rankin (Stroke Patients Only)       Balance Overall balance assessment: Needs assistance Sitting-balance support:  Feet supported Sitting balance-Leahy Scale: Good     Standing balance support: Bilateral upper extremity supported;During functional activity Standing balance-Leahy Scale: Fair                              Cognition Arousal/Alertness: Awake/alert Behavior During Therapy: WFL for tasks assessed/performed Overall Cognitive Status: Within Functional Limits for tasks assessed                                        Exercises Total Joint Exercises Ankle Circles/Pumps: AROM;Both;10 reps;Supine Quad Sets: AROM;Supine;Right;5 reps Short Arc QuadBarbaraann Boys;Right;10 reps;Supine Heel Slides: AROM;AAROM;10 reps;Supine Hip ABduction/ADduction: AAROM;Right;10 reps;Supine Straight Leg Raises: AAROM;Right;5 reps;Supine Long Arc Quad: AAROM;Right;10 reps;Seated Knee Flexion: AAROM;Right;10 reps;Seated Goniometric ROM: 10-50* AAROM R knee (pt reports he lacked full extension prior to surgery)    General Comments        Pertinent Vitals/Pain Pain Score: 4  Pain Location: R knee Pain Descriptors / Indicators: Sore Pain Intervention(s): Limited activity within patient's tolerance;Monitored during session;Premedicated before session;Ice applied    Home Living                      Prior Function            PT Goals (current goals can now be found in the care plan section) Acute Rehab PT Goals Patient Stated Goal: to get more independent, 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, broadcasting PT Goal Formulation: With patient Time For Goal Achievement: 04/04/19 Potential to Achieve Goals: Good Progress towards PT goals: Progressing toward  goals    Frequency    7X/week      PT Plan Current plan remains appropriate    Co-evaluation              AM-PAC PT "6 Clicks" Mobility   Outcome Measure  Help needed turning from your back to your side while in a flat bed without using bedrails?: A Little Help needed moving from lying on your back to sitting on the  side of a flat bed without using bedrails?: A Little Help needed moving to and from a bed to a chair (including a wheelchair)?: None Help needed standing up from a chair using your arms (e.g., wheelchair or bedside chair)?: None Help needed to walk in hospital room?: None Help needed climbing 3-5 steps with a railing? : None 6 Click Score: 22    End of Session Equipment Utilized During Treatment: Gait belt Activity Tolerance: Patient tolerated treatment well Patient left: with call bell/phone within reach;in chair;with nursing/sitter in room Nurse Communication: Mobility status PT Visit Diagnosis: Muscle weakness (generalized) (M62.81);Difficulty in walking, not elsewhere classified (R26.2)     Time: 1126-1202 PT Time Calculation (min) (ACUTE ONLY): 36 min  Charges:  $Gait Training: 8-22 mins $Therapeutic Exercise: 8-22 mins                    Blondell Reveal Kistler PT 03/29/2019  Acute Rehabilitation Services Pager [phone] Office [phone]

## 2020-12-20 IMAGING — CR DG KNEE 1-2V*R*
2 series · 2 of 2 positions shown · non-contrast
Comparison: 07/15/2016, 05/13/2016.

CLINICAL DATA: Chronic LATERAL RIGHT knee pain for many years. No
recent injuries.

EXAM:
RIGHT KNEE - 1-2 VIEW

[w knee ap right]
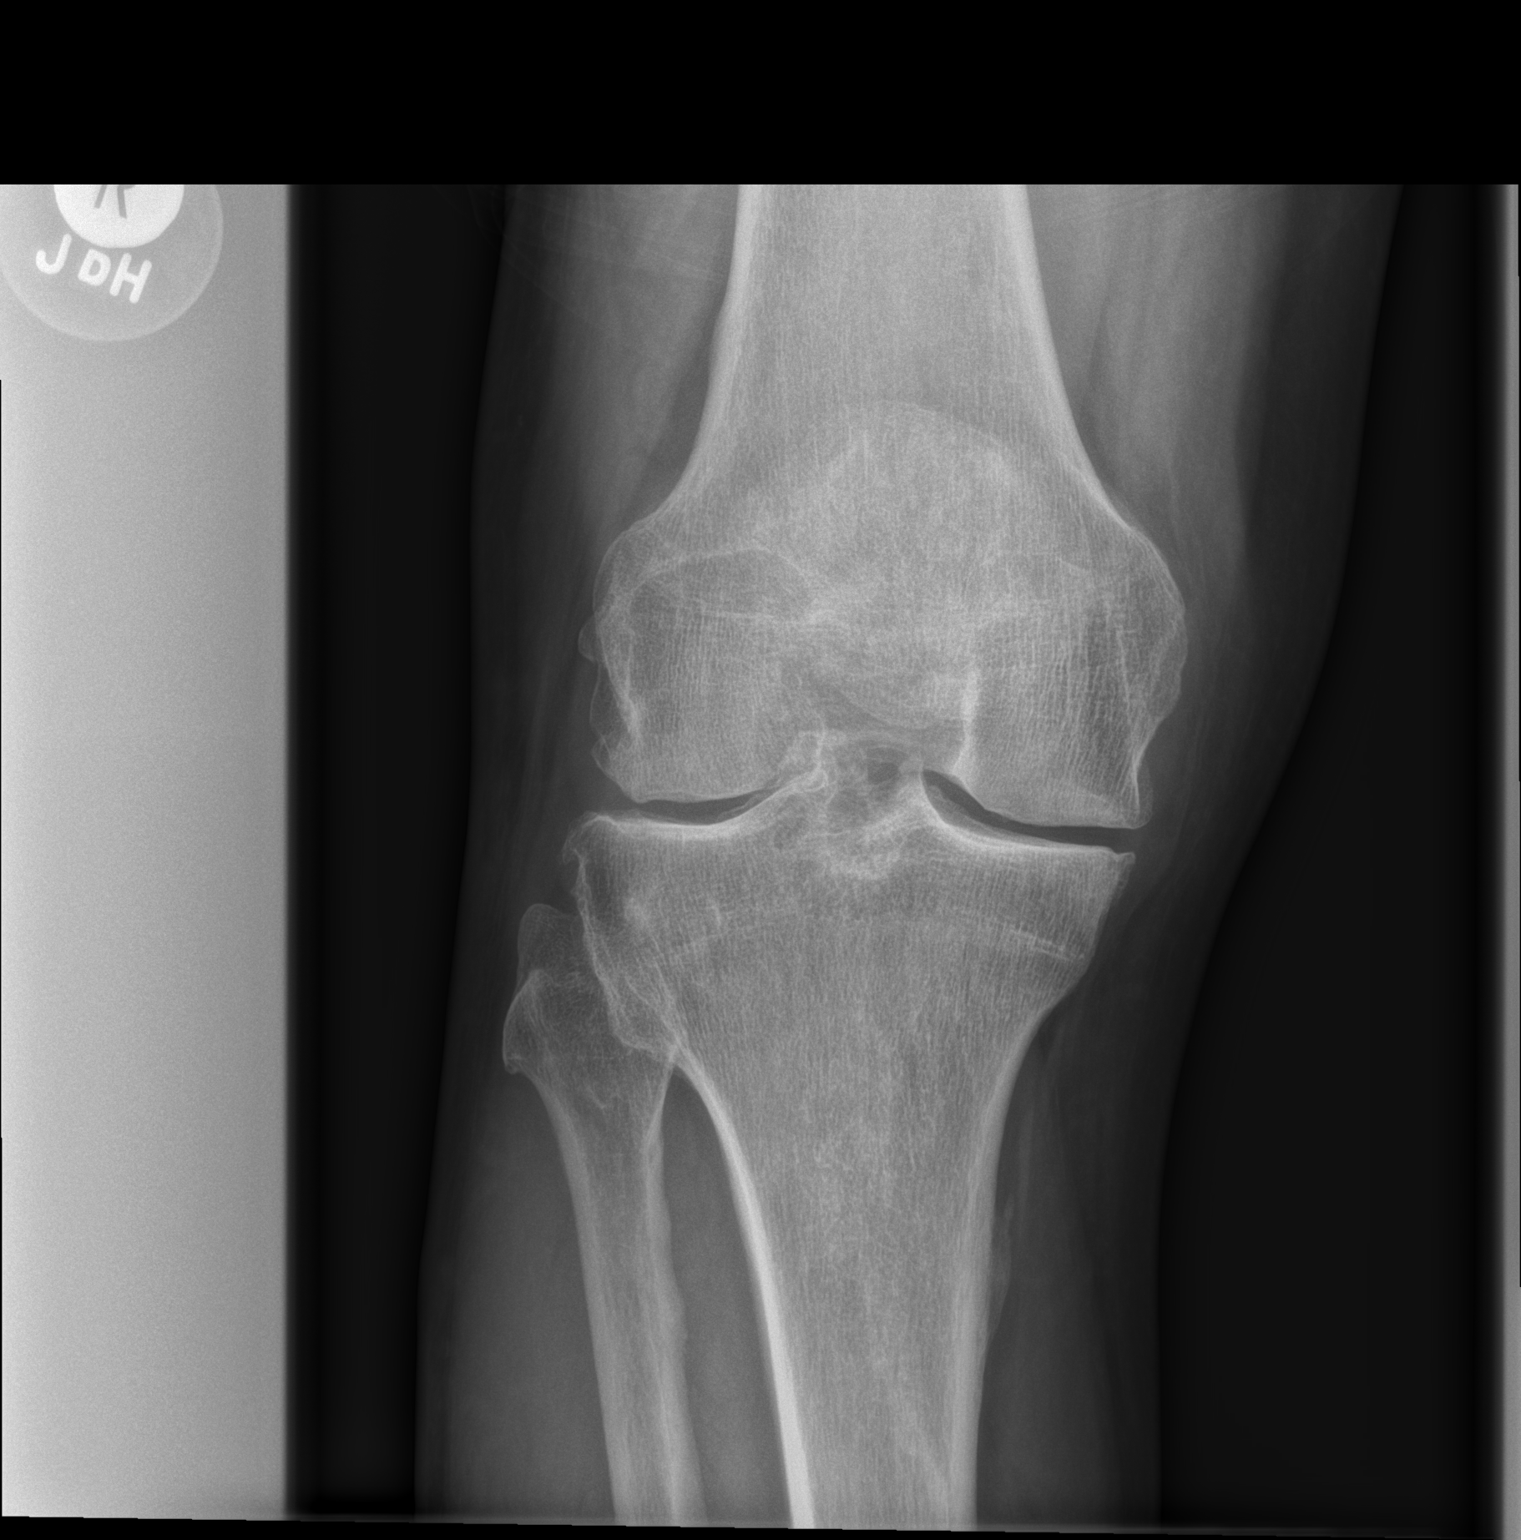

[w knee lat right]
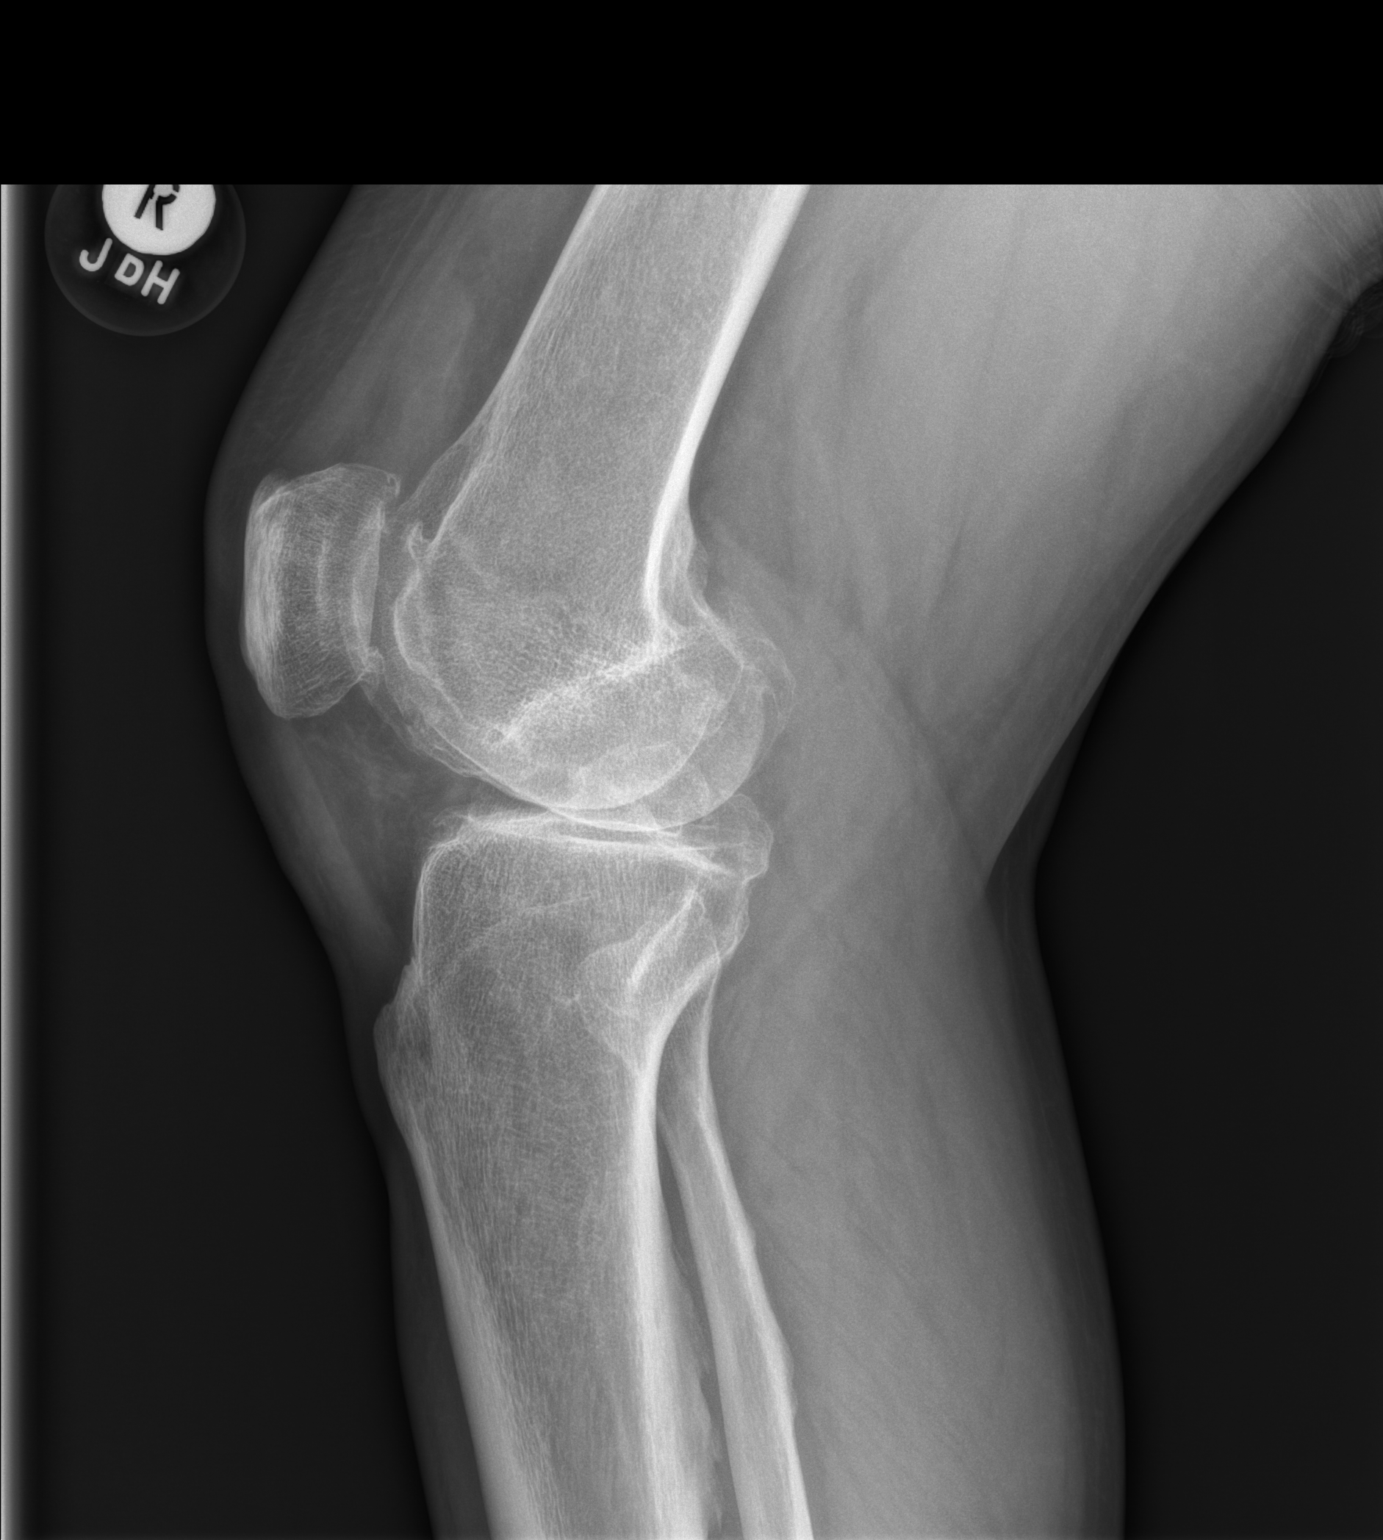

[2 of 2 positions shown; findings below may reference images not displayed]

FINDINGS: AP and LATERAL views were obtained with the patient standing. Severe
MEDIAL and LATERAL compartment joint space narrowing and moderate
patellofemoral compartment joint space narrowing which has
progressed since the examinations in 4512. No evidence of acute,
subacute or healed fractures. Small to moderate-sized joint
effusion. Well-preserved bone mineral density.
IMPRESSION: 1. Tricompartment osteoarthritis, severe in the MEDIAL and LATERAL
compartments and moderate in the patellofemoral compartment,
progressive since [DATE]. Small to moderate-sized joint effusion.

## 2021-11-07 IMAGING — CR DG CHEST 2V
2 series · 2 of 2 positions shown · non-contrast
Comparison: Chest x-ray 07/25/2016.

CLINICAL DATA: 53-year-old male under preoperative evaluation for
knee replacement.

EXAM:
CHEST - 2 VIEW

[w chest pa]
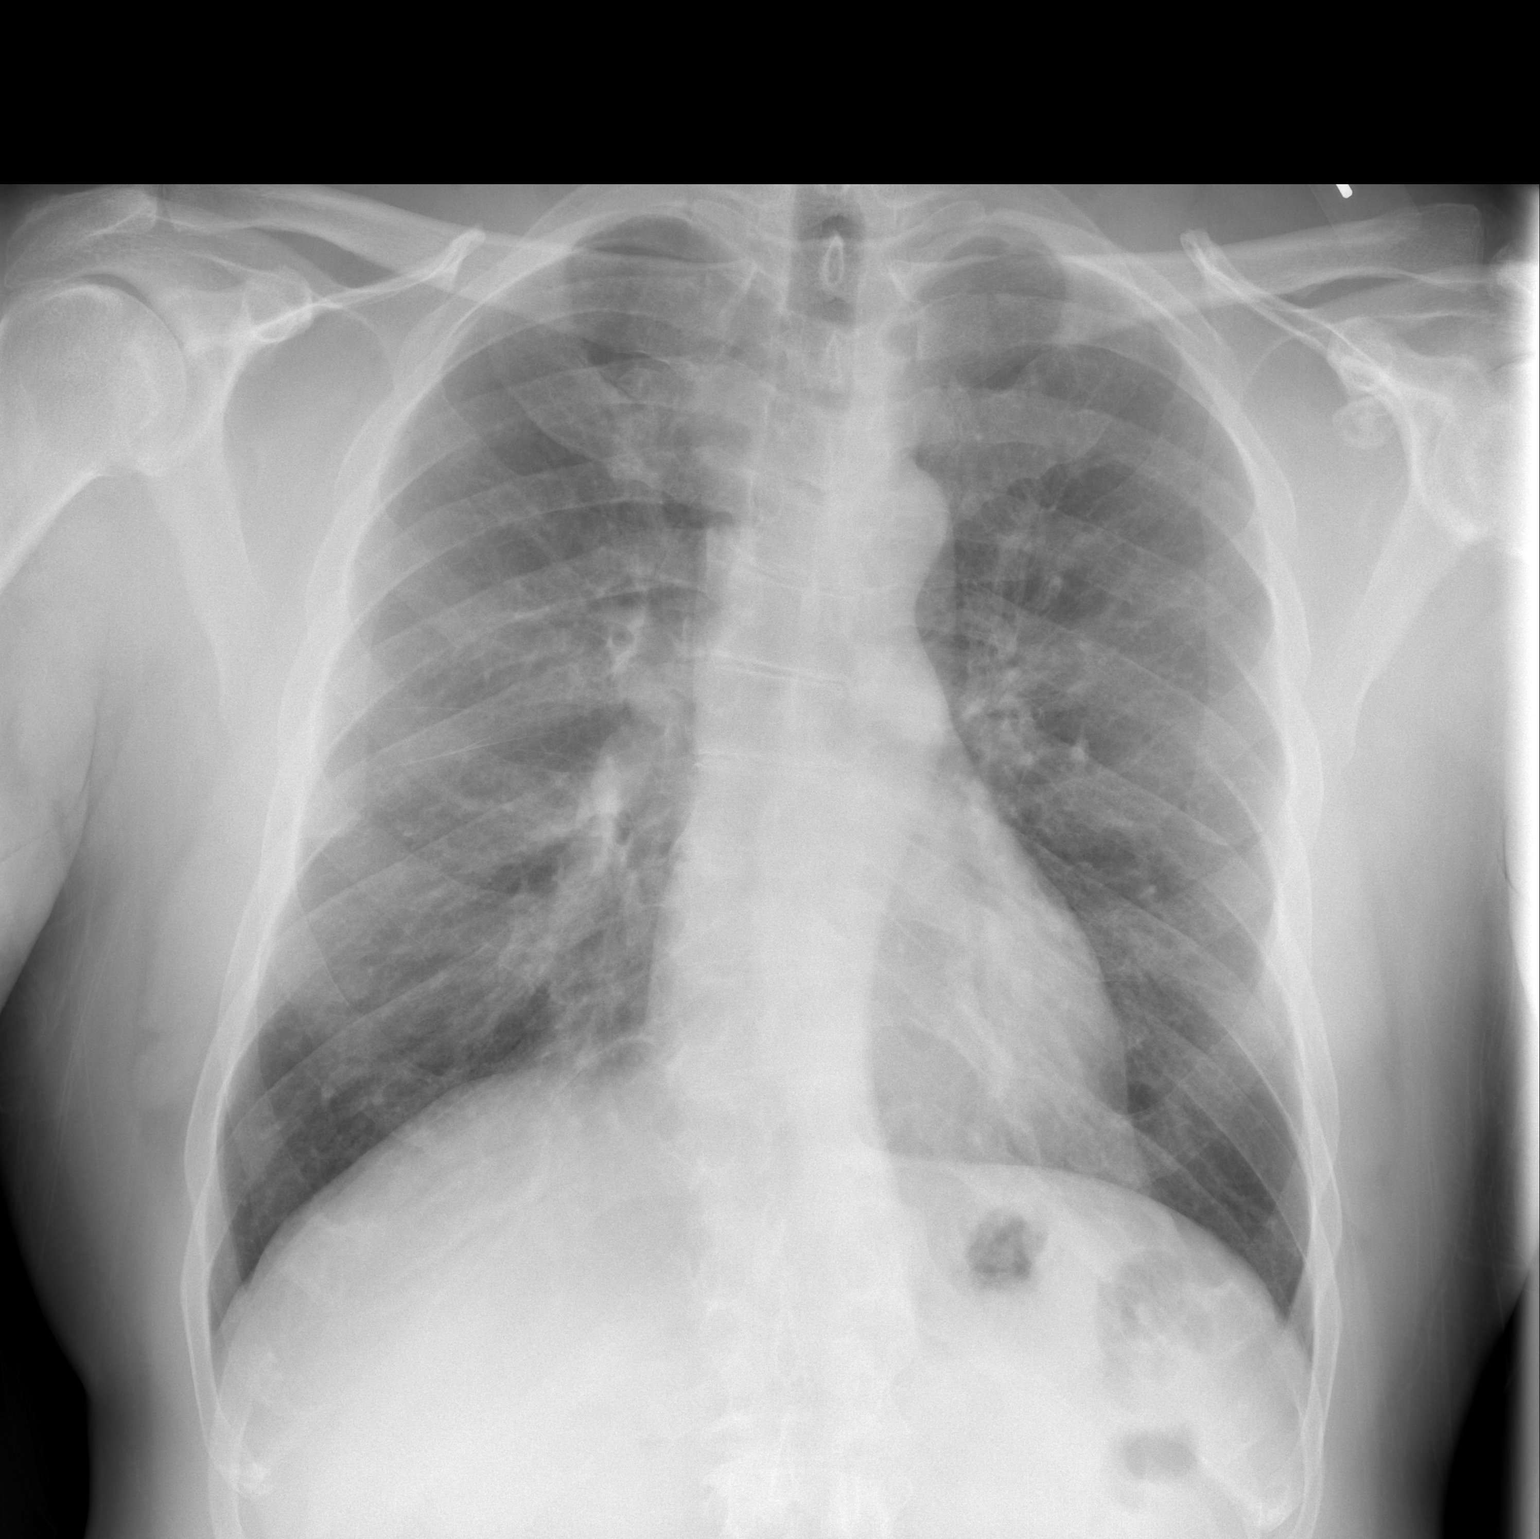

[w chest lat]
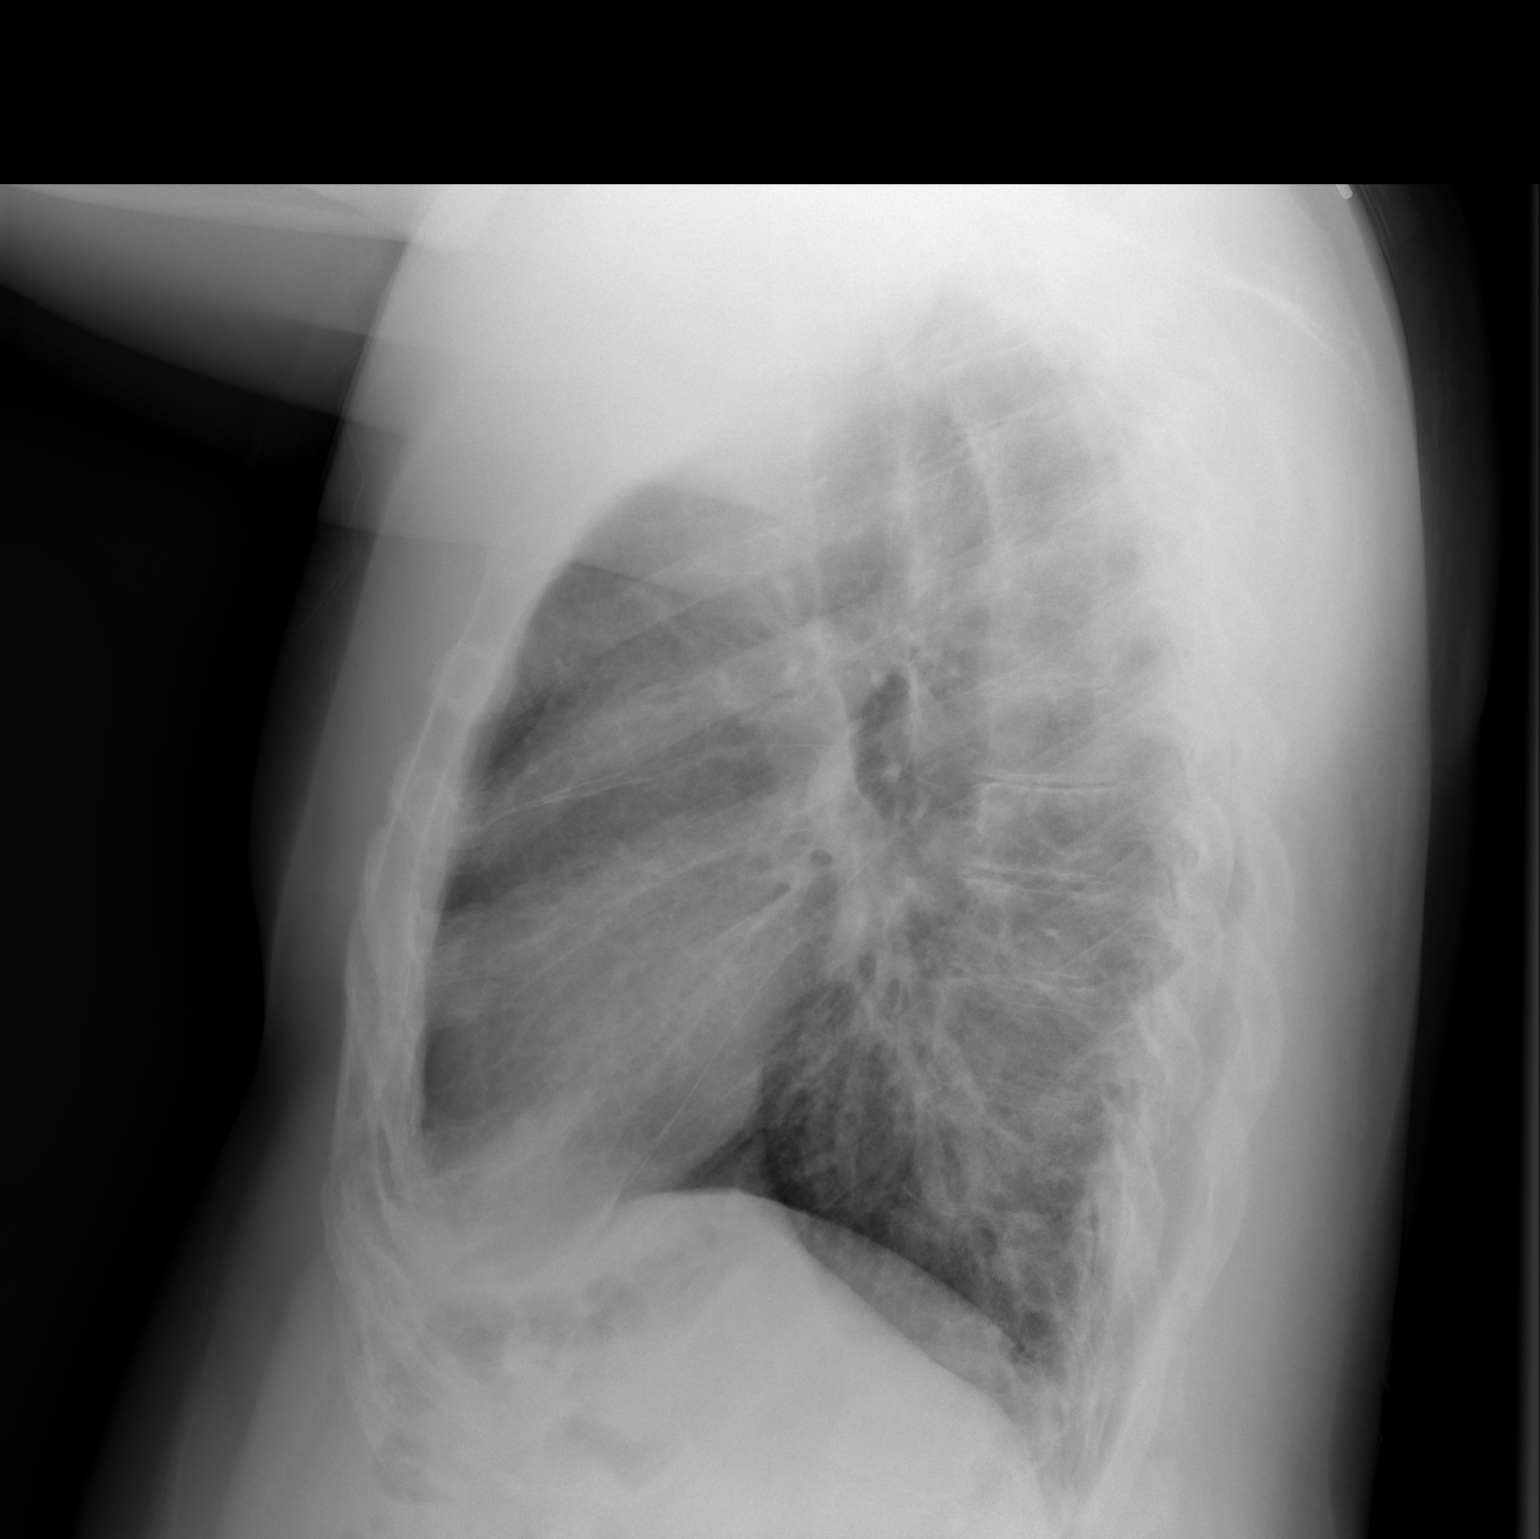

[2 of 2 positions shown; findings below may reference images not displayed]

FINDINGS: Lung volumes are normal. No consolidative airspace disease. No
pleural effusions. No pneumothorax. No pulmonary nodule or mass
noted. Pulmonary vasculature and the cardiomediastinal silhouette
are within normal limits.
IMPRESSION: No radiographic evidence of acute cardiopulmonary disease.

## 2022-04-13 ENCOUNTER — Ambulatory Visit: Payer: BC Managed Care – PPO | Admitting: Internal Medicine

## 2022-05-19 ENCOUNTER — Other Ambulatory Visit: Payer: Self-pay | Admitting: Cardiovascular Disease

## 2022-05-22 ENCOUNTER — Other Ambulatory Visit: Payer: Self-pay | Admitting: Cardiovascular Disease

## 2022-06-01 ENCOUNTER — Other Ambulatory Visit: Payer: Self-pay | Admitting: Internal Medicine

## 2022-06-02 ENCOUNTER — Other Ambulatory Visit: Payer: Self-pay | Admitting: Cardiovascular Disease

## 2022-06-23 ENCOUNTER — Other Ambulatory Visit: Payer: Self-pay

## 2022-06-24 MED ORDER — CARVEDILOL 25 MG PO TABS: 25.0000 mg | ORAL_TABLET | Freq: Two times a day (BID) | ORAL | 1 refills | Status: DC

## 2022-06-29 ENCOUNTER — Other Ambulatory Visit: Payer: Self-pay | Admitting: Cardiovascular Disease

## 2022-07-21 ENCOUNTER — Ambulatory Visit: Payer: BC Managed Care – PPO | Admitting: Internal Medicine

## 2022-08-08 ENCOUNTER — Other Ambulatory Visit: Payer: Self-pay | Admitting: Internal Medicine

## 2022-08-11 ENCOUNTER — Ambulatory Visit: Payer: BC Managed Care – PPO | Admitting: Internal Medicine

## 2022-08-21 ENCOUNTER — Ambulatory Visit: Payer: BC Managed Care – PPO | Admitting: Internal Medicine

## 2022-08-28 ENCOUNTER — Other Ambulatory Visit: Payer: Self-pay | Admitting: Cardiovascular Disease

## 2022-09-12 ENCOUNTER — Other Ambulatory Visit: Payer: Self-pay | Admitting: Cardiovascular Disease

## 2022-09-18 ENCOUNTER — Ambulatory Visit: Payer: BC Managed Care – PPO | Admitting: Internal Medicine

## 2022-11-24 ENCOUNTER — Other Ambulatory Visit: Payer: Self-pay | Admitting: Cardiovascular Disease

## 2022-12-22 ENCOUNTER — Other Ambulatory Visit: Payer: Self-pay | Admitting: Cardiovascular Disease

## 2023-04-03 ENCOUNTER — Other Ambulatory Visit: Payer: Self-pay | Admitting: Internal Medicine

## 2023-05-02 ENCOUNTER — Other Ambulatory Visit: Payer: Self-pay | Admitting: Cardiovascular Disease

## 2023-05-18 ENCOUNTER — Other Ambulatory Visit: Payer: Self-pay | Admitting: Cardiovascular Disease

## 2023-05-18 ENCOUNTER — Other Ambulatory Visit: Payer: Self-pay | Admitting: Internal Medicine

## 2023-05-20 ENCOUNTER — Other Ambulatory Visit: Payer: Self-pay

## 2023-05-25 ENCOUNTER — Other Ambulatory Visit: Payer: Self-pay | Admitting: Internal Medicine

## 2023-05-25 ENCOUNTER — Telehealth: Payer: Self-pay

## 2023-05-25 NOTE — Telephone Encounter (Signed)
 Patients wife is requesting rx for farxiga but this is not on his medlist and not in outside meds, should the patient be taking it? If so can you send rx to [facility]

## 2023-05-26 ENCOUNTER — Telehealth: Payer: Self-pay

## 2023-06-10 ENCOUNTER — Ambulatory Visit: Admitting: Cardiology

## 2023-06-11 ENCOUNTER — Other Ambulatory Visit: Payer: Self-pay

## 2023-06-11 DIAGNOSIS — E785 Hyperlipidemia, unspecified: Secondary | ICD-10-CM

## 2023-06-11 DIAGNOSIS — I10 Essential (primary) hypertension: Secondary | ICD-10-CM

## 2023-06-11 DIAGNOSIS — E669 Obesity, unspecified: Secondary | ICD-10-CM

## 2023-06-12 LAB — CMP14+EGFR
ALT: 34 IU/L (ref 0–44)
AST: 46 IU/L — ABNORMAL HIGH (ref 0–40)
Albumin: 4.3 g/dL (ref 3.8–4.9)
Alkaline Phosphatase: 87 IU/L (ref 44–121)
BUN/Creatinine Ratio: 9 (ref 9–20)
BUN: 12 mg/dL (ref 6–24)
Bilirubin Total: 0.5 mg/dL (ref 0.0–1.2)
CO2: 21 mmol/L (ref 20–29)
Calcium: 9.3 mg/dL (ref 8.7–10.2)
Chloride: 106 mmol/L (ref 96–106)
Creatinine, Ser: 1.41 mg/dL — ABNORMAL HIGH (ref 0.76–1.27)
Globulin, Total: 2.8 g/dL (ref 1.5–4.5)
Glucose: 106 mg/dL — ABNORMAL HIGH (ref 70–99)
Potassium: 4.6 mmol/L (ref 3.5–5.2)
Sodium: 142 mmol/L (ref 134–144)
Total Protein: 7.1 g/dL (ref 6.0–8.5)
eGFR: 58 mL/min/{1.73_m2} — ABNORMAL LOW (ref >59)

## 2023-06-12 LAB — LIPID PANEL
Chol/HDL Ratio: 4 ratio (ref 0.0–5.0)
Cholesterol, Total: 155 mg/dL (ref 100–199)
HDL: 39 mg/dL — ABNORMAL LOW (ref >39)
LDL Chol Calc (NIH): 97 mg/dL (ref 0–99)
Triglycerides: 100 mg/dL (ref 0–149)
VLDL Cholesterol Cal: 19 mg/dL (ref 5–40)

## 2023-06-12 LAB — HEMOGLOBIN A1C
Est. average glucose Bld gHb Est-mCnc: 143 mg/dL
Hgb A1c MFr Bld: 6.6 % — ABNORMAL HIGH (ref 4.8–5.6)

## 2023-06-12 LAB — TSH: TSH: 0.328 u[IU]/mL — ABNORMAL LOW (ref 0.450–4.500)

## 2023-06-13 ENCOUNTER — Other Ambulatory Visit: Payer: Self-pay | Admitting: Cardiovascular Disease

## 2023-06-14 ENCOUNTER — Encounter: Payer: Self-pay | Admitting: Cardiology

## 2023-06-14 ENCOUNTER — Ambulatory Visit (INDEPENDENT_AMBULATORY_CARE_PROVIDER_SITE_OTHER): Admitting: Cardiology

## 2023-06-14 VITALS — BP 128/88 | HR 61 | Ht 70.0 in | Wt 224.0 lb

## 2023-06-14 DIAGNOSIS — E7800 Pure hypercholesterolemia, unspecified: Secondary | ICD-10-CM | POA: Diagnosis not present

## 2023-06-14 DIAGNOSIS — I10 Essential (primary) hypertension: Secondary | ICD-10-CM | POA: Diagnosis not present

## 2023-06-14 DIAGNOSIS — R7303 Prediabetes: Secondary | ICD-10-CM | POA: Diagnosis not present

## 2023-06-14 NOTE — Progress Notes (Signed)
 Established Patient Office Visit  Subjective:  Patient ID: Jason Black, male    DOB: 1965/09/13  Age: 58 y.o. MRN: [id number]  Chief Complaint  Patient presents with   Acute Visit    Elevated BP x 1 Month    Patient in office for an acute visit, complaining of elevated blood pressure. Patient doing well, no complaints today. Blood pressure improved on recheck. Will continue same therapy.  Discussed recent lab work. Kidney function decreased, increase water intake. TSH abnormal, recommend rechecking in one month.     No other concerns at this time.   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   Procedure: RIGHT TOTAL KNEE ARTHROPLASTY;  Surgeon: Marcene Corning, MD;  Location: [facility];  Service: Orthopedics;  Laterality: Right;    Social History   Socioeconomic History   Marital status: Married    Spouse name: Not on file   Number of children: Not on file   Years of education: Not on file   Highest education level: Not on file  Occupational History   Not on file  Tobacco Use   Smoking status: Every Day    Types: Cigarettes   Smokeless tobacco: Never  Vaping Use   Vaping status: Never Used  Substance and Sexual Activity   Alcohol use: No   Drug use: No   Sexual activity: Not on file  Other Topics Concern   Not on file  Social History Narrative   Not on file   Social Drivers of Health   Financial Resource Strain: Not on file  Food Insecurity: Not on file  Transportation Needs: Not on file  Physical Activity: Not on file  Stress: Not on file  Social Connections: Unknown (07/20/2021)   [facility]   Social Network    Social Network: Not on file  Intimate Partner Violence: Not At Risk (05/26/2023)   Received from [facility]   HITS    Over the  last 12 months how often did your partner physically hurt you?: Never    Over the last 12 months how often did your partner insult you or talk down to you?: Never    Over the last 12 months how often did your partner threaten you with physical harm?: Never    Over the last 12 months how often did your partner scream or curse at you?: Never    Family History  Problem Relation Age of Onset   Hypertension Mother    Hypertension Father    Hypertension Sister    Hypertension Brother     No Known Allergies  Outpatient Medications Prior to Visit  Medication Sig   allopurinol (ZYLOPRIM) 300 MG tablet TAKE 1 TABLET BY MOUTH ONCE DAILY IN THE MORNING   aspirin EC 81 MG tablet Take 1 tablet (81 mg total) by mouth 2 (two) times daily after a meal.   atorvastatin (LIPITOR) 40 MG tablet Take 40 mg by mouth at bedtime.   carvedilol (COREG) 25 MG tablet Take 1 tablet (25 mg total) by mouth 2 (two) times daily with a meal.   FARXIGA 10 MG TABS tablet TAKE 1 TABLET BY MOUTH ONCE DAILY IN THE MORNING   furosemide (LASIX) 20 MG tablet Take 1 tablet  by mouth once daily   hydrALAZINE (APRESOLINE) 100 MG tablet Take 1 tablet by mouth twice daily   lisinopril (ZESTRIL) 40 MG tablet Take 1 tablet by mouth once daily   [DISCONTINUED] amLODipine (NORVASC) 10 MG tablet Take 1 tablet by mouth once daily (Patient not taking: Reported on 06/14/2023)   [DISCONTINUED] Camphor-Menthol-Methyl Sal (TIGER BALM MUSCLE RUB EX) Apply 1 application topically at bedtime.   [DISCONTINUED] isosorbide dinitrate (ISORDIL) 30 MG tablet Take 30 mg by mouth daily.   [DISCONTINUED] MITIGARE 0.6 MG CAPS Take 0.6-1.2 mg by mouth See admin instructions. Take 1.2 mg at first sign of gout then take 0.6 mg 1 hour later then take 0.6 mg once daily until gout pain subsides   [DISCONTINUED] Multiple Vitamin (MULTIVITAMIN WITH MINERALS) TABS tablet Take 1 tablet by mouth daily.   No facility-administered medications prior to visit.    Review  of Systems  Constitutional: Negative.   HENT: Negative.    Eyes: Negative.   Respiratory: Negative.  Negative for shortness of breath.   Cardiovascular: Negative.  Negative for chest pain.  Gastrointestinal: Negative.  Negative for abdominal pain, constipation and diarrhea.  Genitourinary: Negative.   Musculoskeletal:  Negative for joint pain and myalgias.  Skin: Negative.   Neurological: Negative.  Negative for dizziness and headaches.  Endo/Heme/Allergies: Negative.   All other systems reviewed and are negative.      Objective:   BP 128/88 (BP Location: Right Arm, Patient Position: Sitting, Cuff Size: Large)   Pulse 61   Ht 5\' 10"  (1.778 m)   Wt 224 lb (101.6 kg)   SpO2 97%   BMI 32.14 kg/m   Vitals:   06/14/23 0909 06/14/23 0929  BP: (!) 168/84 128/88  Pulse: 61   Height: 5\' 10"  (1.778 m)   Weight: 224 lb (101.6 kg)   SpO2: 97%   BMI (Calculated): 32.14     Physical Exam Vitals and nursing note reviewed.  Constitutional:      Appearance: Normal appearance. He is normal weight.  HENT:     Head: Normocephalic and atraumatic.     Nose: Nose normal.     Mouth/Throat:     Mouth: Mucous membranes are moist.     Pharynx: Oropharynx is clear.  Eyes:     Extraocular Movements: Extraocular movements intact.     Conjunctiva/sclera: Conjunctivae normal.     Pupils: Pupils are equal, round, and reactive to light.  Cardiovascular:     Rate and Rhythm: Normal rate and regular rhythm.     Pulses: Normal pulses.     Heart sounds: Normal heart sounds.  Pulmonary:     Effort: Pulmonary effort is normal.     Breath sounds: Normal breath sounds.  Abdominal:     General: Abdomen is flat. Bowel sounds are normal.     Palpations: Abdomen is soft.  Musculoskeletal:        General: Normal range of motion.     Cervical back: Normal range of motion.  Skin:    General: Skin is warm and dry.  Neurological:     General: No focal deficit present.     Mental Status: He is alert  and oriented to person, place, and time.  Psychiatric:        Mood and Affect: Mood normal.        Behavior: Behavior normal.        Thought Content: Thought content normal.        Judgment: Judgment normal.  No results found for any visits on 06/14/23.  Recent Results (from the past 2160 hours)  Hemoglobin A1c     Status: Abnormal   Collection Time: 06/11/23 11:32 AM  Result Value Ref Range   Hgb A1c MFr Bld 6.6 (H) 4.8 - 5.6 %    Comment:          Prediabetes: 5.7 - 6.4          Diabetes: >6.4          Glycemic control for adults with diabetes: <7.0    Est. average glucose Bld gHb Est-mCnc 143 mg/dL  TSH     Status: Abnormal   Collection Time: 06/11/23 11:32 AM  Result Value Ref Range   TSH 0.328 (L) 0.450 - 4.500 uIU/mL  CMP14+EGFR     Status: Abnormal   Collection Time: 06/11/23 11:32 AM  Result Value Ref Range   Glucose 106 (H) 70 - 99 mg/dL   BUN 12 6 - 24 mg/dL   Creatinine, Ser 1.61 (H) 0.76 - 1.27 mg/dL   eGFR 58 (L) >09 UE/AVW/0.98   BUN/Creatinine Ratio 9 9 - 20   Sodium 142 134 - 144 mmol/L   Potassium 4.6 3.5 - 5.2 mmol/L   Chloride 106 96 - 106 mmol/L   CO2 21 20 - 29 mmol/L   Calcium 9.3 8.7 - 10.2 mg/dL   Total Protein 7.1 6.0 - 8.5 g/dL   Albumin 4.3 3.8 - 4.9 g/dL   Globulin, Total 2.8 1.5 - 4.5 g/dL   Bilirubin Total 0.5 0.0 - 1.2 mg/dL   Alkaline Phosphatase 87 44 - 121 IU/L   AST 46 (H) 0 - 40 IU/L   ALT 34 0 - 44 IU/L  Lipid panel     Status: Abnormal   Collection Time: 06/11/23 11:32 AM  Result Value Ref Range   Cholesterol, Total 155 100 - 199 mg/dL   Triglycerides 119 0 - 149 mg/dL   HDL 39 (L) >14 mg/dL   VLDL Cholesterol Cal 19 5 - 40 mg/dL   LDL Chol Calc (NIH) 97 0 - 99 mg/dL   Chol/HDL Ratio 4.0 0.0 - 5.0 ratio    Comment:                                   T. Chol/HDL Ratio                                             Men  Women                               1/2 Avg.Risk  3.4    3.3                                   Avg.Risk   5.0    4.4                                2X Avg.Risk  9.6    7.1  3X Avg.Risk 23.4   11.0       Assessment & Plan:  Continue same medications.   Problem List Items Addressed This Visit       Cardiovascular and Mediastinum   Essential hypertension - Primary     Other   Hypercholesteremia   Prediabetes    Return in about 4 weeks (around 07/12/2023) for with TJ .   Total time spent: 25 minutes  [employer], NP  06/14/2023   This document may have been prepared by Dragon Voice Recognition software and as such may include unintentional dictation errors.

## 2023-06-17 ENCOUNTER — Ambulatory Visit (HOSPITAL_COMMUNITY)

## 2023-07-06 NOTE — Telephone Encounter (Signed)
 A user error has taken place: encounter opened in error, closed for administrative reasons.

## 2023-07-12 ENCOUNTER — Ambulatory Visit: Admitting: Internal Medicine

## 2023-07-26 ENCOUNTER — Other Ambulatory Visit: Payer: Self-pay

## 2023-07-26 MED ORDER — ATORVASTATIN CALCIUM 20 MG PO TABS: 20.0000 mg | ORAL_TABLET | Freq: Every day | ORAL | 0 refills | Status: DC

## 2023-08-03 ENCOUNTER — Ambulatory Visit: Admitting: Internal Medicine

## 2023-08-06 ENCOUNTER — Other Ambulatory Visit: Payer: Self-pay

## 2023-08-06 ENCOUNTER — Other Ambulatory Visit

## 2023-08-06 MED ORDER — FARXIGA 10 MG PO TABS: 10.0000 mg | ORAL_TABLET | Freq: Every morning | ORAL | 0 refills | Status: DC

## 2023-08-08 ENCOUNTER — Other Ambulatory Visit: Payer: Self-pay | Admitting: Internal Medicine

## 2023-08-16 ENCOUNTER — Other Ambulatory Visit: Payer: Self-pay

## 2023-08-16 MED ORDER — CARVEDILOL 25 MG PO TABS: 25.0000 mg | ORAL_TABLET | Freq: Two times a day (BID) | ORAL | 0 refills | Status: DC

## 2023-08-17 ENCOUNTER — Ambulatory Visit: Admitting: Internal Medicine

## 2023-09-06 ENCOUNTER — Ambulatory Visit: Admitting: Internal Medicine

## 2023-09-18 ENCOUNTER — Other Ambulatory Visit: Payer: Self-pay | Admitting: Internal Medicine

## 2023-10-31 ENCOUNTER — Other Ambulatory Visit: Payer: Self-pay | Admitting: Internal Medicine

## 2023-11-19 ENCOUNTER — Other Ambulatory Visit: Payer: Self-pay | Admitting: Cardiovascular Disease

## 2023-11-19 ENCOUNTER — Other Ambulatory Visit: Payer: Self-pay | Admitting: Internal Medicine

## 2023-12-16 ENCOUNTER — Other Ambulatory Visit: Payer: Self-pay | Admitting: Internal Medicine

## 2023-12-27 ENCOUNTER — Other Ambulatory Visit: Payer: Self-pay | Admitting: Internal Medicine

## 2024-01-06 ENCOUNTER — Other Ambulatory Visit: Payer: Self-pay | Admitting: Internal Medicine

## 2024-01-10 ENCOUNTER — Other Ambulatory Visit: Payer: Self-pay | Admitting: Internal Medicine

## 2024-01-15 ENCOUNTER — Encounter (HOSPITAL_COMMUNITY): Payer: Self-pay | Admitting: *Deleted

## 2024-01-15 ENCOUNTER — Emergency Department (HOSPITAL_COMMUNITY)

## 2024-01-15 ENCOUNTER — Emergency Department (HOSPITAL_COMMUNITY)
Admission: EM | Admit: 2024-01-15 | Discharge: 2024-01-15 | Disposition: A | Discharge status: Home / Self Care | Attending: Emergency Medicine | Admitting: Emergency Medicine

## 2024-01-15 ENCOUNTER — Other Ambulatory Visit: Payer: Self-pay

## 2024-01-15 DIAGNOSIS — S6292XA Unspecified fracture of left wrist and hand, initial encounter for closed fracture: Secondary | ICD-10-CM | POA: Insufficient documentation

## 2024-01-15 DIAGNOSIS — M545 Low back pain, unspecified: Secondary | ICD-10-CM | POA: Insufficient documentation

## 2024-01-15 DIAGNOSIS — Z7982 Long term (current) use of aspirin: Secondary | ICD-10-CM | POA: Insufficient documentation

## 2024-01-15 DIAGNOSIS — S161XXA Strain of muscle, fascia and tendon at neck level, initial encounter: Secondary | ICD-10-CM | POA: Diagnosis not present

## 2024-01-15 DIAGNOSIS — S6992XA Unspecified injury of left wrist, hand and finger(s), initial encounter: Secondary | ICD-10-CM | POA: Diagnosis present

## 2024-01-15 DIAGNOSIS — Y92410 Unspecified street and highway as the place of occurrence of the external cause: Secondary | ICD-10-CM | POA: Diagnosis not present

## 2024-01-15 MED ORDER — KETOROLAC TROMETHAMINE 15 MG/ML IJ SOLN
15.0000 mg | Freq: Once | INTRAMUSCULAR | Status: AC
Start: 2024-01-15 — End: 2024-01-15
  Administered 2024-01-15: 15 mg via INTRAMUSCULAR
  Filled 2024-01-15: qty 1

## 2024-01-15 MED ORDER — METHOCARBAMOL 500 MG PO TABS: 500.0000 mg | ORAL_TABLET | Freq: Two times a day (BID) | ORAL | 0 refills | Status: AC

## 2024-01-15 MED ORDER — LIDOCAINE 5 % EX PTCH
1.0000 | MEDICATED_PATCH | CUTANEOUS | Status: DC
  Administered 2024-01-15: 1 via TRANSDERMAL
  Filled 2024-01-15: qty 1

## 2024-01-15 MED ORDER — LIDOCAINE 5 % EX PTCH: 1.0000 | MEDICATED_PATCH | CUTANEOUS | 0 refills | Status: AC

## 2024-01-15 MED ORDER — METHOCARBAMOL 500 MG PO TABS
500.0000 mg | ORAL_TABLET | Freq: Once | ORAL | Status: AC
  Administered 2024-01-15: 500 mg via ORAL
  Filled 2024-01-15: qty 1

## 2024-01-15 MED ORDER — ETODOLAC 400 MG PO TABS: 400.0000 mg | ORAL_TABLET | Freq: Two times a day (BID) | ORAL | 0 refills | Status: DC

## 2024-01-15 NOTE — ED Notes (Signed)
 RN called ortho tech to request service.  Tech states he will arrive shortly.

## 2024-01-15 NOTE — Discharge Instructions (Addendum)
 Your x-ray showed potential fracture.  Not entirely sure if this is a fracture.  You will need to have a repeat x-ray performed to confirm this.  Hand clinic Information given.  CT scan of your neck and back did not show any concerning findings.  Likely have a muscle strain.  I have sent in a muscle relaxer.  It is called Robaxin .  This will make you drowsy so be careful after taking this medication.  Do not do anything dangerous after taking this medicine.  Lodine is an anti-inflammatory medication.  Is similar to ibuprofen  so do not take ibuprofen  or Aleve while you are taking this medication.  Return for any emergent symptoms.

## 2024-01-15 NOTE — Progress Notes (Signed)
 Orthopedic Tech Progress Note Patient Details:  Jason Black [facility] [birth year] [id number]  Ortho Devices Type of Ortho Device: Volar splint Ortho Device/Splint Location: lue Ortho Device/Splint Interventions: Ordered, Application, Adjustment   Post Interventions Patient Tolerated: Well Instructions Provided: Care of device, Adjustment of device  Chandra Dorn PARAS 01/15/2024, 8:06 PM
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
## 2024-01-17 ENCOUNTER — Other Ambulatory Visit: Payer: Self-pay | Admitting: Internal Medicine

## 2024-01-17 ENCOUNTER — Ambulatory Visit (INDEPENDENT_AMBULATORY_CARE_PROVIDER_SITE_OTHER): Admitting: Internal Medicine

## 2024-01-17 VITALS — BP 126/74 | HR 66 | Temp 98.4°F | Ht 70.0 in | Wt 219.6 lb

## 2024-01-17 DIAGNOSIS — M1A9XX0 Chronic gout, unspecified, without tophus (tophi): Secondary | ICD-10-CM | POA: Insufficient documentation

## 2024-01-17 DIAGNOSIS — E119 Type 2 diabetes mellitus without complications: Secondary | ICD-10-CM

## 2024-01-17 DIAGNOSIS — E7800 Pure hypercholesterolemia, unspecified: Secondary | ICD-10-CM

## 2024-01-17 DIAGNOSIS — I10 Essential (primary) hypertension: Secondary | ICD-10-CM

## 2024-01-17 DIAGNOSIS — N400 Enlarged prostate without lower urinary tract symptoms: Secondary | ICD-10-CM

## 2024-01-17 DIAGNOSIS — S335XXD Sprain of ligaments of lumbar spine, subsequent encounter: Secondary | ICD-10-CM | POA: Diagnosis not present

## 2024-01-17 DIAGNOSIS — Z794 Long term (current) use of insulin: Secondary | ICD-10-CM | POA: Diagnosis not present

## 2024-01-17 DIAGNOSIS — E785 Hyperlipidemia, unspecified: Secondary | ICD-10-CM

## 2024-01-17 DIAGNOSIS — K4090 Unilateral inguinal hernia, without obstruction or gangrene, not specified as recurrent: Secondary | ICD-10-CM | POA: Diagnosis not present

## 2024-01-17 LAB — POCT CBG (FASTING - GLUCOSE)-MANUAL ENTRY: Glucose Fasting, POC: 90 mg/dL (ref 70–99)

## 2024-01-17 MED ORDER — FARXIGA 10 MG PO TABS: 10.0000 mg | ORAL_TABLET | Freq: Every day | ORAL | 0 refills | Status: DC

## 2024-01-17 MED ORDER — ATORVASTATIN CALCIUM 20 MG PO TABS: 20.0000 mg | ORAL_TABLET | Freq: Every evening | ORAL | 0 refills | Status: DC

## 2024-01-17 MED ORDER — ALLOPURINOL 300 MG PO TABS: 300.0000 mg | ORAL_TABLET | Freq: Every day | ORAL | 0 refills | Status: DC

## 2024-01-17 MED ORDER — CARVEDILOL 25 MG PO TABS: 25.0000 mg | ORAL_TABLET | Freq: Two times a day (BID) | ORAL | 0 refills | Status: DC
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
## 2024-01-18 ENCOUNTER — Other Ambulatory Visit: Payer: Self-pay

## 2024-01-18 MED ORDER — LISINOPRIL 40 MG PO TABS: 40.0000 mg | ORAL_TABLET | Freq: Every day | ORAL | 0 refills | Status: DC

## 2024-01-19 ENCOUNTER — Ambulatory Visit (INDEPENDENT_AMBULATORY_CARE_PROVIDER_SITE_OTHER): Admitting: Physician Assistant

## 2024-01-19 ENCOUNTER — Encounter: Payer: Self-pay | Admitting: Physician Assistant

## 2024-01-19 DIAGNOSIS — M5442 Lumbago with sciatica, left side: Secondary | ICD-10-CM

## 2024-01-19 DIAGNOSIS — M5441 Lumbago with sciatica, right side: Secondary | ICD-10-CM

## 2024-01-19 DIAGNOSIS — G8929 Other chronic pain: Secondary | ICD-10-CM | POA: Diagnosis not present

## 2024-01-19 DIAGNOSIS — M5440 Lumbago with sciatica, unspecified side: Secondary | ICD-10-CM

## 2024-01-19 DIAGNOSIS — M25532 Pain in left wrist: Secondary | ICD-10-CM

## 2024-01-19 DIAGNOSIS — M545 Low back pain, unspecified: Secondary | ICD-10-CM | POA: Insufficient documentation

## 2024-01-19 MED ORDER — METHYLPREDNISOLONE 4 MG PO TBPK
ORAL_TABLET | ORAL | 0 refills | Status: AC
Start: 2024-01-19 — End: ?
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
## 2024-01-21 ENCOUNTER — Other Ambulatory Visit: Payer: Self-pay | Admitting: Internal Medicine

## 2024-01-21 DIAGNOSIS — Z794 Long term (current) use of insulin: Secondary | ICD-10-CM

## 2024-01-25 ENCOUNTER — Other Ambulatory Visit: Payer: Self-pay | Admitting: Internal Medicine

## 2024-01-25 DIAGNOSIS — M1A9XX0 Chronic gout, unspecified, without tophus (tophi): Secondary | ICD-10-CM

## 2024-01-25 DIAGNOSIS — I10 Essential (primary) hypertension: Secondary | ICD-10-CM

## 2024-02-02 ENCOUNTER — Telehealth: Payer: Self-pay | Admitting: Internal Medicine

## 2024-02-02 ENCOUNTER — Telehealth: Payer: Self-pay

## 2024-02-02 NOTE — Telephone Encounter (Signed)
 Jason Black did not prescribe these meds. Pt will need to contact his PCP to get these refilled, I spoke with pt and advised him. Pt voiced understanding

## 2024-02-02 NOTE — Telephone Encounter (Signed)
 Patient would like a Rx refill on Lodine  and methocarbamol  sent to his pharmacy.  CB# [id number].  Please advise.  Thank you.

## 2024-02-04 ENCOUNTER — Other Ambulatory Visit: Payer: Self-pay | Admitting: Cardiovascular Disease

## 2024-02-07 ENCOUNTER — Other Ambulatory Visit: Payer: Self-pay | Admitting: Internal Medicine

## 2024-02-07 ENCOUNTER — Other Ambulatory Visit: Payer: Self-pay

## 2024-02-07 MED ORDER — ETODOLAC 400 MG PO TABS: 400.0000 mg | ORAL_TABLET | Freq: Two times a day (BID) | ORAL | 0 refills | Status: AC

## 2024-02-08 NOTE — Telephone Encounter (Signed)
 Dr. Albina responded to the request.

## 2024-02-10 ENCOUNTER — Other Ambulatory Visit: Payer: Self-pay | Admitting: Cardiovascular Disease

## 2024-02-11 ENCOUNTER — Ambulatory Visit: Admitting: Cardiovascular Disease

## 2024-02-14 ENCOUNTER — Encounter: Admitting: Internal Medicine

## 2024-02-16 ENCOUNTER — Ambulatory Visit: Admitting: Physician Assistant

## 2024-02-17 ENCOUNTER — Other Ambulatory Visit: Payer: Self-pay

## 2024-02-17 ENCOUNTER — Ambulatory Visit (INDEPENDENT_AMBULATORY_CARE_PROVIDER_SITE_OTHER): Admitting: Physician Assistant

## 2024-02-17 ENCOUNTER — Encounter: Payer: Self-pay | Admitting: Physician Assistant

## 2024-02-17 DIAGNOSIS — M25532 Pain in left wrist: Secondary | ICD-10-CM
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
## 2024-02-18 ENCOUNTER — Ambulatory Visit: Admitting: Cardiovascular Disease

## 2024-02-20 ENCOUNTER — Other Ambulatory Visit: Payer: Self-pay | Admitting: Internal Medicine

## 2024-02-20 DIAGNOSIS — E119 Type 2 diabetes mellitus without complications: Secondary | ICD-10-CM

## 2024-02-22 ENCOUNTER — Other Ambulatory Visit: Payer: Self-pay | Admitting: Cardiovascular Disease

## 2024-02-22 ENCOUNTER — Encounter: Admitting: Internal Medicine

## 2024-02-24 ENCOUNTER — Ambulatory Visit: Attending: Physician Assistant

## 2024-02-24 DIAGNOSIS — M25532 Pain in left wrist: Secondary | ICD-10-CM | POA: Insufficient documentation

## 2024-02-24 DIAGNOSIS — M5459 Other low back pain: Secondary | ICD-10-CM | POA: Insufficient documentation

## 2024-02-24 DIAGNOSIS — M25632 Stiffness of left wrist, not elsewhere classified: Secondary | ICD-10-CM | POA: Diagnosis present

## 2024-02-24 DIAGNOSIS — M62830 Muscle spasm of back: Secondary | ICD-10-CM | POA: Diagnosis present
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
## 2024-02-25 ENCOUNTER — Encounter: Payer: Self-pay | Admitting: Cardiovascular Disease

## 2024-02-25 ENCOUNTER — Telehealth: Payer: Self-pay

## 2024-02-25 ENCOUNTER — Ambulatory Visit (INDEPENDENT_AMBULATORY_CARE_PROVIDER_SITE_OTHER): Admitting: Cardiovascular Disease

## 2024-02-25 ENCOUNTER — Other Ambulatory Visit: Payer: Self-pay | Admitting: Internal Medicine

## 2024-02-25 VITALS — BP 128/78 | HR 55 | Ht 70.0 in | Wt 224.0 lb

## 2024-02-25 DIAGNOSIS — I10 Essential (primary) hypertension: Secondary | ICD-10-CM | POA: Diagnosis not present

## 2024-02-25 DIAGNOSIS — Z794 Long term (current) use of insulin: Secondary | ICD-10-CM

## 2024-02-25 DIAGNOSIS — R002 Palpitations: Secondary | ICD-10-CM | POA: Diagnosis not present

## 2024-02-25 DIAGNOSIS — E119 Type 2 diabetes mellitus without complications: Secondary | ICD-10-CM

## 2024-02-25 DIAGNOSIS — E785 Hyperlipidemia, unspecified: Secondary | ICD-10-CM

## 2024-02-25 DIAGNOSIS — F17210 Nicotine dependence, cigarettes, uncomplicated: Secondary | ICD-10-CM | POA: Diagnosis not present

## 2024-02-25 DIAGNOSIS — R0789 Other chest pain: Secondary | ICD-10-CM | POA: Diagnosis not present

## 2024-02-25 DIAGNOSIS — R9431 Abnormal electrocardiogram [ECG] [EKG]: Secondary | ICD-10-CM

## 2024-02-25 MED ORDER — FUROSEMIDE 20 MG PO TABS: 20.0000 mg | ORAL_TABLET | Freq: Every day | ORAL | 0 refills | Status: DC

## 2024-02-25 MED ORDER — FUROSEMIDE 20 MG PO TABS: 20.0000 mg | ORAL_TABLET | Freq: Every day | ORAL | 0 refills | Status: AC

## 2024-02-25 MED ORDER — BRILINTA 90 MG PO TABS: 90.0000 mg | ORAL_TABLET | Freq: Two times a day (BID) | ORAL | 1 refills | Status: DC

## 2024-02-25 MED ORDER — ISOSORBIDE MONONITRATE ER 30 MG PO TB24: 30.0000 mg | ORAL_TABLET | Freq: Every day | ORAL | 1 refills | Status: AC

## 2024-02-25 NOTE — Progress Notes (Signed)
 "     Cardiology Office Note   Date:  02/25/2024   ID:  VAL SCHIAVO, DOB 1965/10/13, MRN [id number]  PCP:  Albina GORMAN Dine, MD  Cardiologist:  Denyse Bathe, MD      History of Present Illness: Jason Black is a 58 y.o. male who presents for  Chief Complaint  Patient presents with   Follow-up    Med refills    [id number] came for evaluation as has chest pain and SOB.  Patient states that he has been having chest pain intermittently but not right now.  EKG showed sinus bradycardia 53 bpm with 2 mm ST elevation in lead V2 but no ST elevation in V1 or V3.  Patient was asked repeatedly if he is having chest pain but he denies any chest pain.  Echocardiogram is stat ordered in order to evaluation wall motion abnormality due to ST elevation.  Chest Pain  This is a new problem. The current episode started yesterday. The pain is present in the lateral region and substernal region. The pain is at a severity of 4/10. Pertinent negatives include no near-syncope, syncope or vomiting.      Past Medical History:  Diagnosis Date   Cocaine abuse (HCC)    Gout    High cholesterol    Hypertension      Past Surgical History:  Procedure Laterality Date   KNEE SURGERY     TOTAL KNEE ARTHROPLASTY Right 03/28/2019   Procedure: RIGHT TOTAL KNEE ARTHROPLASTY;  Surgeon: Sheril Coy, MD;  Location: [facility];  Service: Orthopedics;  Laterality: Right;     Current Outpatient Medications  Medication Sig Dispense Refill   allopurinol  (ZYLOPRIM ) 300 MG tablet TAKE 1 TABLET BY MOUTH ONCE DAILY IN THE MORNING . APPOINTMENT REQUIRED FOR FUTURE REFILLS 30 tablet 0   aspirin  EC 81 MG tablet Take 1 tablet (81 mg total) by mouth 2 (two) times daily after a meal. 30 tablet 0   atorvastatin  (LIPITOR) 20 MG tablet Take 1 tablet (20 mg total) by mouth at bedtime. 30 tablet 0   BRILINTA 90 MG TABS tablet Take 1 tablet (90 mg total) by mouth 2 (two) times daily. 60 tablet 1   carvedilol  (COREG ) 25 MG  tablet TAKE 1 TABLET BY MOUTH TWICE DAILY WITH A MEAL . APPOINTMENT REQUIRED FOR FUTURE REFILLS 60 tablet 0   hydrALAZINE  (APRESOLINE ) 100 MG tablet Take 1 tablet by mouth twice daily 180 tablet 0   isosorbide  mononitrate (IMDUR) 30 MG 24 hr tablet Take 1 tablet (30 mg total) by mouth daily. 30 tablet 1   lidocaine  (LIDODERM ) 5 % Place 1 patch onto the skin daily. Remove & Discard patch within 12 hours or as directed by MD 14 patch 0   lisinopril  (ZESTRIL ) 40 MG tablet Take 1 tablet (40 mg total) by mouth daily. 90 tablet 0   methocarbamol  (ROBAXIN ) 500 MG tablet Take 1 tablet (500 mg total) by mouth 2 (two) times daily. 20 tablet 0   methylPREDNISolone  (MEDROL  DOSEPAK) 4 MG TBPK tablet Take as directed with food 21 tablet 0   furosemide (LASIX) 20 MG tablet Take 1 tablet (20 mg total) by mouth daily. 90 tablet 0   No current facility-administered medications for this visit.    Allergies:   Patient has no known allergies.    Social History:   reports that he has been smoking cigarettes. He has never used smokeless tobacco. He reports that he does not drink alcohol and does not  use drugs.   Family History:  family history includes Hypertension in his brother, father, mother, and sister.    ROS:     Review of Systems  Constitutional: Negative.   HENT: Negative.    Eyes: Negative.   Respiratory: Negative.    Cardiovascular:  Positive for chest pain. Negative for syncope and near-syncope.  Gastrointestinal: Negative.  Negative for vomiting.  Genitourinary: Negative.   Musculoskeletal: Negative.   Skin: Negative.   Neurological: Negative.   Endo/Heme/Allergies: Negative.   Psychiatric/Behavioral: Negative.    All other systems reviewed and are negative.     All other systems are reviewed and negative.    PHYSICAL EXAM: VS:  BP 128/78   Pulse (!) 55   Ht 5' 10 (1.778 m)   Wt 224 lb (101.6 kg)   SpO2 97%   BMI 32.14 kg/m  , BMI Body mass index is 32.14 kg/m. Last weight:   Wt Readings from Last 3 Encounters:  02/25/24 224 lb (101.6 kg)  01/17/24 219 lb 9.6 oz (99.6 kg)  01/15/24 223 lb 15.8 oz (101.6 kg)     Physical Exam Vitals reviewed.  Constitutional:      Appearance: Normal appearance. He is normal weight.  HENT:     Head: Normocephalic.     Nose: Nose normal.     Mouth/Throat:     Mouth: Mucous membranes are moist.  Eyes:     Pupils: Pupils are equal, round, and reactive to light.  Cardiovascular:     Rate and Rhythm: Normal rate and regular rhythm.     Pulses: Normal pulses.     Heart sounds: Normal heart sounds.  Pulmonary:     Effort: Pulmonary effort is normal.  Abdominal:     General: Abdomen is flat. Bowel sounds are normal.  Musculoskeletal:        General: Normal range of motion.     Cervical back: Normal range of motion.  Skin:    General: Skin is warm.  Neurological:     General: No focal deficit present.     Mental Status: He is alert.  Psychiatric:        Mood and Affect: Mood normal.       EKG: Sinus bradycardia 53 bpm with incomplete right bundle branch block and left axis deviation and isolated 2 mm ST elevation in V2 with nonspecific ST-T changes in the rest of the leads.  Echo stat is being done to look at the wall motion as patient is not having chest pain at this time although had it in the past.  Recent Labs: 06/11/2023: ALT 34; BUN 12; Creatinine, Ser 1.41; Potassium 4.6; Sodium 142; TSH 0.328    Lipid Panel    Component Value Date/Time   CHOL 155 06/11/2023 1132   TRIG 100 06/11/2023 1132   HDL 39 (L) 06/11/2023 1132   CHOLHDL 4.0 06/11/2023 1132   LDLCALC 97 06/11/2023 1132      Other studies Reviewed: Additional studies/ records that were reviewed today include:  Review of the above records demonstrates:       No data to display            ASSESSMENT AND PLAN:    ICD-10-CM   1. ST elevation  R94.31 PCV ECHOCARDIOGRAM COMPLETE    MYOCARDIAL PERFUSION IMAGING    BRILINTA 90 MG TABS  tablet    isosorbide  mononitrate (IMDUR) 30 MG 24 hr tablet    furosemide (LASIX) 20 MG tablet   EKG  shows ST elevation V1-V2, advise stat echo.  Will look at wall motion abnormality to decide if patient needs to be admitted or outpatient stress test.    2. Essential hypertension  I10 PCV ECHOCARDIOGRAM COMPLETE    MYOCARDIAL PERFUSION IMAGING    BRILINTA 90 MG TABS tablet    isosorbide  mononitrate (IMDUR) 30 MG 24 hr tablet    furosemide (LASIX) 20 MG tablet    3. Hyperlipidemia, unspecified hyperlipidemia type  E78.5 PCV ECHOCARDIOGRAM COMPLETE    MYOCARDIAL PERFUSION IMAGING    BRILINTA 90 MG TABS tablet    isosorbide  mononitrate (IMDUR) 30 MG 24 hr tablet    furosemide (LASIX) 20 MG tablet    4. Type 2 diabetes mellitus without complication, with long-term current use of insulin (HCC)  E11.9 PCV ECHOCARDIOGRAM COMPLETE   Z79.4 MYOCARDIAL PERFUSION IMAGING    BRILINTA 90 MG TABS tablet    isosorbide  mononitrate (IMDUR) 30 MG 24 hr tablet    furosemide (LASIX) 20 MG tablet    5. Other chest pain  R07.89 PCV ECHOCARDIOGRAM COMPLETE    MYOCARDIAL PERFUSION IMAGING    BRILINTA 90 MG TABS tablet    isosorbide  mononitrate (IMDUR) 30 MG 24 hr tablet    furosemide (LASIX) 20 MG tablet   Has chest pain and ST elevation in V2 only.  Will start the patient on aspirin  and Plavix and isosorbide .  If no WMA, we will schedule stress test.    6. Palpitation  R00.2 PCV ECHOCARDIOGRAM COMPLETE    MYOCARDIAL PERFUSION IMAGING    BRILINTA 90 MG TABS tablet    isosorbide  mononitrate (IMDUR) 30 MG 24 hr tablet    furosemide (LASIX) 20 MG tablet    7. Abnormal electrocardiogram (ECG) (EKG)  R94.31    Echocardiogram revealed mild hypokinesis of the septal wall with overall normal left ventricular systolic function.  Thus patient will have nuclear stress test.       Problem List Items Addressed This Visit       Cardiovascular and Mediastinum   Essential hypertension   Relevant Medications    BRILINTA 90 MG TABS tablet   isosorbide  mononitrate (IMDUR) 30 MG 24 hr tablet   furosemide (LASIX) 20 MG tablet   Other Relevant Orders   PCV ECHOCARDIOGRAM COMPLETE   MYOCARDIAL PERFUSION IMAGING   Other Visit Diagnoses       ST elevation    -  Primary   EKG shows ST elevation V1-V2, advise stat echo.  Will look at wall motion abnormality to decide if patient needs to be admitted or outpatient stress test.   Relevant Medications   BRILINTA 90 MG TABS tablet   isosorbide  mononitrate (IMDUR) 30 MG 24 hr tablet   furosemide (LASIX) 20 MG tablet   Other Relevant Orders   PCV ECHOCARDIOGRAM COMPLETE   MYOCARDIAL PERFUSION IMAGING     Hyperlipidemia, unspecified hyperlipidemia type       Relevant Medications   BRILINTA 90 MG TABS tablet   isosorbide  mononitrate (IMDUR) 30 MG 24 hr tablet   furosemide (LASIX) 20 MG tablet   Other Relevant Orders   PCV ECHOCARDIOGRAM COMPLETE   MYOCARDIAL PERFUSION IMAGING     Type 2 diabetes mellitus without complication, with long-term current use of insulin (HCC)       Relevant Medications   BRILINTA 90 MG TABS tablet   isosorbide  mononitrate (IMDUR) 30 MG 24 hr tablet   furosemide (LASIX) 20 MG tablet   Other Relevant Orders   PCV ECHOCARDIOGRAM COMPLETE  MYOCARDIAL PERFUSION IMAGING     Other chest pain       Has chest pain and ST elevation in V2 only.  Will start the patient on aspirin  and Plavix and isosorbide .  If no WMA, we will schedule stress test.   Relevant Medications   BRILINTA 90 MG TABS tablet   isosorbide  mononitrate (IMDUR) 30 MG 24 hr tablet   furosemide (LASIX) 20 MG tablet   Other Relevant Orders   PCV ECHOCARDIOGRAM COMPLETE   MYOCARDIAL PERFUSION IMAGING     Palpitation       Relevant Medications   BRILINTA 90 MG TABS tablet   isosorbide  mononitrate (IMDUR) 30 MG 24 hr tablet   furosemide (LASIX) 20 MG tablet   Other Relevant Orders   PCV ECHOCARDIOGRAM COMPLETE   MYOCARDIAL PERFUSION IMAGING     Abnormal  electrocardiogram (ECG) (EKG)       Echocardiogram revealed mild hypokinesis of the septal wall with overall normal left ventricular systolic function.  Thus patient will have nuclear stress test.          Disposition:  7 Return in about 2 weeks (around 03/10/2024) for Echo stat is being done an outpatient stress test will be done and a follow-up.   100 minutes  Signed,  Denyse Bathe, MD  02/25/2024 3:36 PM    [facility] "
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
## 2024-02-28 ENCOUNTER — Telehealth: Payer: Self-pay

## 2024-02-28 ENCOUNTER — Other Ambulatory Visit: Payer: Self-pay

## 2024-02-28 ENCOUNTER — Other Ambulatory Visit: Payer: Self-pay | Admitting: Internal Medicine

## 2024-02-28 ENCOUNTER — Other Ambulatory Visit

## 2024-02-28 ENCOUNTER — Encounter: Admitting: Internal Medicine

## 2024-02-28 DIAGNOSIS — E785 Hyperlipidemia, unspecified: Secondary | ICD-10-CM

## 2024-02-28 DIAGNOSIS — R9431 Abnormal electrocardiogram [ECG] [EKG]: Secondary | ICD-10-CM

## 2024-02-28 DIAGNOSIS — R0789 Other chest pain: Secondary | ICD-10-CM

## 2024-02-28 DIAGNOSIS — I10 Essential (primary) hypertension: Secondary | ICD-10-CM

## 2024-02-28 DIAGNOSIS — E119 Type 2 diabetes mellitus without complications: Secondary | ICD-10-CM

## 2024-02-28 DIAGNOSIS — S335XXD Sprain of ligaments of lumbar spine, subsequent encounter: Secondary | ICD-10-CM

## 2024-02-28 DIAGNOSIS — R002 Palpitations: Secondary | ICD-10-CM

## 2024-02-28 MED ORDER — CYCLOBENZAPRINE HCL 10 MG PO TABS: 10.0000 mg | ORAL_TABLET | Freq: Three times a day (TID) | ORAL | 1 refills | Status: AC | PRN

## 2024-02-28 NOTE — Telephone Encounter (Signed)
 Pt states his new medication is too expensive and is wanting an alternative however pt des not know he name of the medication he is referring to.

## 2024-02-29 ENCOUNTER — Telehealth: Payer: Self-pay

## 2024-02-29 ENCOUNTER — Other Ambulatory Visit: Payer: Self-pay

## 2024-02-29 MED ORDER — BRILINTA 90 MG PO TABS: 90.0000 mg | ORAL_TABLET | Freq: Two times a day (BID) | ORAL | 1 refills | Status: DC

## 2024-02-29 NOTE — Telephone Encounter (Signed)
 Left message to return call

## 2024-02-29 NOTE — Telephone Encounter (Signed)
 Pharmacy called to inform us  that pt's insurance will not cover Brilinta  and the generic version should be sent in instead.

## 2024-02-29 NOTE — Telephone Encounter (Signed)
Rx request sent.

## 2024-03-03 MED ORDER — CLOPIDOGREL BISULFATE 75 MG PO TABS: 75.0000 mg | ORAL_TABLET | Freq: Every day | ORAL | 0 refills | Status: DC

## 2024-03-04 ENCOUNTER — Encounter (HOSPITAL_COMMUNITY): Payer: Self-pay | Admitting: Emergency Medicine

## 2024-03-04 ENCOUNTER — Emergency Department (HOSPITAL_COMMUNITY)
Admission: EM | Admit: 2024-03-04 | Discharge: 2024-03-05 | Discharge status: Against Medical Advice | Attending: Emergency Medicine | Admitting: Emergency Medicine

## 2024-03-04 ENCOUNTER — Emergency Department (HOSPITAL_COMMUNITY)

## 2024-03-04 DIAGNOSIS — R079 Chest pain, unspecified: Secondary | ICD-10-CM | POA: Diagnosis present

## 2024-03-04 DIAGNOSIS — Z5321 Procedure and treatment not carried out due to patient leaving prior to being seen by health care provider: Secondary | ICD-10-CM | POA: Diagnosis not present

## 2024-03-04 DIAGNOSIS — R0602 Shortness of breath: Secondary | ICD-10-CM | POA: Diagnosis not present

## 2024-03-04 DIAGNOSIS — R5383 Other fatigue: Secondary | ICD-10-CM | POA: Diagnosis not present

## 2024-03-04 LAB — COMPREHENSIVE METABOLIC PANEL WITH GFR
ALT: 25 U/L (ref 0–44)
AST: 30 U/L (ref 15–41)
Albumin: 4.2 g/dL (ref 3.5–5.0)
Alkaline Phosphatase: 69 U/L (ref 38–126)
Anion gap: 7 (ref 5–15)
BUN: 14 mg/dL (ref 6–20)
CO2: 30 mmol/L (ref 22–32)
Calcium: 9.4 mg/dL (ref 8.9–10.3)
Chloride: 101 mmol/L (ref 98–111)
Creatinine, Ser: 1.41 mg/dL — ABNORMAL HIGH (ref 0.61–1.24)
GFR, Estimated: 58 mL/min — ABNORMAL LOW
Glucose, Bld: 100 mg/dL — ABNORMAL HIGH (ref 70–99)
Potassium: 4.5 mmol/L (ref 3.5–5.1)
Sodium: 138 mmol/L (ref 135–145)
Total Bilirubin: 0.3 mg/dL (ref 0.0–1.2)
Total Protein: 7.1 g/dL (ref 6.5–8.1)

## 2024-03-04 LAB — CBC WITH DIFFERENTIAL/PLATELET
Abs Immature Granulocytes: 0.02 K/uL (ref 0.00–0.07)
Basophils Absolute: 0.1 K/uL (ref 0.0–0.1)
Basophils Relative: 1 %
Eosinophils Absolute: 0.2 K/uL (ref 0.0–0.5)
Eosinophils Relative: 2 %
HCT: 44.7 % (ref 39.0–52.0)
Hemoglobin: 14.9 g/dL (ref 13.0–17.0)
Immature Granulocytes: 0 %
Lymphocytes Relative: 33 %
Lymphs Abs: 2.5 K/uL (ref 0.7–4.0)
MCH: 29.9 pg (ref 26.0–34.0)
MCHC: 33.3 g/dL (ref 30.0–36.0)
MCV: 89.6 fL (ref 80.0–100.0)
Monocytes Absolute: 0.6 K/uL (ref 0.1–1.0)
Monocytes Relative: 8 %
Neutro Abs: 4.3 K/uL (ref 1.7–7.7)
Neutrophils Relative %: 56 %
Platelets: 194 K/uL (ref 150–400)
RBC: 4.99 MIL/uL (ref 4.22–5.81)
RDW: 12.9 % (ref 11.5–15.5)
WBC: 7.6 K/uL (ref 4.0–10.5)
nRBC: 0 % (ref 0.0–0.2)

## 2024-03-04 LAB — RESP PANEL BY RT-PCR (RSV, FLU A&B, COVID)  RVPGX2
Influenza A by PCR: NEGATIVE
Influenza B by PCR: NEGATIVE
Resp Syncytial Virus by PCR: NEGATIVE
SARS Coronavirus 2 by RT PCR: NEGATIVE

## 2024-03-04 LAB — TROPONIN T, HIGH SENSITIVITY: Troponin T High Sensitivity: <15 ng/L (ref 0–19)

## 2024-03-04 NOTE — ED Notes (Addendum)
 Pt reports he was started on blood thinners bc doc thought he had heart attack. This was a week ago. Today he presents due to fatigue and weakness. Denies doing cocaine for 90 days plus.

## 2024-03-04 NOTE — ED Provider Triage Note (Signed)
 Emergency Medicine Provider Triage Evaluation Note  Jason Black , a 58 y.o. male  was evaluated in triage.  Pt complains of chest pain/shortness of breath/fatigue.  Left-sided chest pain has been ongoing for 1 week, patient notes today that he became increasingly more short of breath and fatigued, which is unusual for him.  Review of Systems  Positive: As above Negative: As above  Physical Exam  BP (!) 159/82 (BP Location: Right Arm)   Pulse (!) 55   Temp 97.7 F (36.5 C)   Resp 18   SpO2 100%  Gen:   Awake, no distress   Resp:  Normal effort  MSK:   Moves extremities without difficulty  Other:    Medical Decision Making  Medically screening exam initiated at 7:52 PM.  Appropriate orders placed.  Jason Black was informed that the remainder of the evaluation will be completed by another provider, this initial triage assessment does not replace that evaluation, and the importance of remaining in the ED until their evaluation is complete.     Jason Black SAILOR, [place] 03/04/24 [phone]

## 2024-03-04 NOTE — ED Triage Notes (Signed)
 PT c/o chest pain and SOB x 1 week.  States symptoms increased today - esp generalized weakness.  Denies fever or cough, but c/o slight headache.

## 2024-03-04 NOTE — ED Notes (Addendum)
 Pt stated he was leaving due to long wait. Stated we did not want to help him. I explained to pt that this was not the case.

## 2024-03-06 ENCOUNTER — Encounter: Payer: Self-pay | Admitting: Physician Assistant

## 2024-03-06 ENCOUNTER — Other Ambulatory Visit: Payer: Self-pay

## 2024-03-06 MED ORDER — CLOPIDOGREL BISULFATE 75 MG PO TABS: 75.0000 mg | ORAL_TABLET | Freq: Every day | ORAL | 0 refills | Status: DC

## 2024-03-07 ENCOUNTER — Ambulatory Visit

## 2024-03-07 DIAGNOSIS — M5459 Other low back pain: Secondary | ICD-10-CM

## 2024-03-07 DIAGNOSIS — M25532 Pain in left wrist: Secondary | ICD-10-CM | POA: Diagnosis not present

## 2024-03-07 DIAGNOSIS — M62830 Muscle spasm of back: Secondary | ICD-10-CM

## 2024-03-07 DIAGNOSIS — M25632 Stiffness of left wrist, not elsewhere classified: Secondary | ICD-10-CM
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
## 2024-03-10 ENCOUNTER — Ambulatory Visit
Admission: RE | Admit: 2024-03-10 | Discharge: 2024-03-10 | Disposition: A | Discharge status: Home / Self Care | Source: Ambulatory Visit | Attending: Physician Assistant | Admitting: Physician Assistant

## 2024-03-10 DIAGNOSIS — M25532 Pain in left wrist: Secondary | ICD-10-CM

## 2024-03-13 ENCOUNTER — Encounter

## 2024-03-16 ENCOUNTER — Ambulatory Visit: Attending: Physician Assistant

## 2024-03-16 DIAGNOSIS — M25632 Stiffness of left wrist, not elsewhere classified: Secondary | ICD-10-CM | POA: Insufficient documentation

## 2024-03-16 DIAGNOSIS — M62830 Muscle spasm of back: Secondary | ICD-10-CM | POA: Diagnosis present

## 2024-03-16 DIAGNOSIS — M25532 Pain in left wrist: Secondary | ICD-10-CM | POA: Insufficient documentation

## 2024-03-16 DIAGNOSIS — M5459 Other low back pain: Secondary | ICD-10-CM | POA: Insufficient documentation
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
## 2024-03-20 ENCOUNTER — Encounter: Admitting: Internal Medicine

## 2024-03-20 ENCOUNTER — Encounter

## 2024-03-22 ENCOUNTER — Ambulatory Visit: Admitting: Physical Therapy

## 2024-03-23 ENCOUNTER — Ambulatory Visit: Admitting: Physical Therapy

## 2024-03-23 ENCOUNTER — Encounter: Payer: Self-pay | Admitting: Physical Therapy

## 2024-03-23 DIAGNOSIS — M5459 Other low back pain: Secondary | ICD-10-CM

## 2024-03-23 DIAGNOSIS — M25632 Stiffness of left wrist, not elsewhere classified: Secondary | ICD-10-CM

## 2024-03-23 DIAGNOSIS — M25532 Pain in left wrist: Secondary | ICD-10-CM | POA: Diagnosis not present

## 2024-03-23 DIAGNOSIS — M62830 Muscle spasm of back: Secondary | ICD-10-CM
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
## 2024-03-24 ENCOUNTER — Ambulatory Visit: Admitting: Cardiovascular Disease

## 2024-03-26 ENCOUNTER — Other Ambulatory Visit: Payer: Self-pay | Admitting: Internal Medicine

## 2024-03-26 DIAGNOSIS — E785 Hyperlipidemia, unspecified: Secondary | ICD-10-CM

## 2024-03-26 DIAGNOSIS — M1A9XX0 Chronic gout, unspecified, without tophus (tophi): Secondary | ICD-10-CM

## 2024-03-27 ENCOUNTER — Ambulatory Visit: Admitting: Physical Therapy

## 2024-03-29 ENCOUNTER — Encounter: Payer: Self-pay | Admitting: Physical Therapy

## 2024-03-29 ENCOUNTER — Ambulatory Visit: Admitting: Physical Therapy

## 2024-03-29 DIAGNOSIS — M5459 Other low back pain: Secondary | ICD-10-CM

## 2024-03-29 DIAGNOSIS — M62830 Muscle spasm of back: Secondary | ICD-10-CM

## 2024-03-29 DIAGNOSIS — M25532 Pain in left wrist: Secondary | ICD-10-CM

## 2024-03-29 DIAGNOSIS — M25632 Stiffness of left wrist, not elsewhere classified: Secondary | ICD-10-CM
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
## 2024-03-30 ENCOUNTER — Encounter

## 2024-03-30 ENCOUNTER — Ambulatory Visit

## 2024-03-30 ENCOUNTER — Other Ambulatory Visit: Payer: Self-pay | Admitting: Internal Medicine

## 2024-03-30 ENCOUNTER — Other Ambulatory Visit

## 2024-03-30 ENCOUNTER — Ambulatory Visit: Admitting: Orthopedic Surgery

## 2024-03-30 DIAGNOSIS — M24232 Disorder of ligament, left wrist: Secondary | ICD-10-CM

## 2024-03-30 DIAGNOSIS — E119 Type 2 diabetes mellitus without complications: Secondary | ICD-10-CM

## 2024-03-30 DIAGNOSIS — M1A9XX0 Chronic gout, unspecified, without tophus (tophi): Secondary | ICD-10-CM
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
## 2024-03-31 LAB — COMPREHENSIVE METABOLIC PANEL WITH GFR
ALT: 32 IU/L (ref 0–44)
AST: 29 IU/L (ref 0–40)
Albumin: 4.4 g/dL (ref 3.8–4.9)
Alkaline Phosphatase: 87 IU/L (ref 47–123)
BUN/Creatinine Ratio: 14 (ref 9–20)
BUN: 17 mg/dL (ref 6–24)
Bilirubin Total: 0.3 mg/dL (ref 0.0–1.2)
CO2: 21 mmol/L (ref 20–29)
Calcium: 9.5 mg/dL (ref 8.7–10.2)
Chloride: 105 mmol/L (ref 96–106)
Creatinine, Ser: 1.22 mg/dL (ref 0.76–1.27)
Globulin, Total: 2.7 g/dL (ref 1.5–4.5)
Glucose: 106 mg/dL — ABNORMAL HIGH (ref 70–99)
Potassium: 4.6 mmol/L (ref 3.5–5.2)
Sodium: 140 mmol/L (ref 134–144)
Total Protein: 7.1 g/dL (ref 6.0–8.5)
eGFR: 69 mL/min/1.73

## 2024-03-31 LAB — CBC WITH DIFF/PLATELET
Basophils Absolute: 0 x10E3/uL (ref 0.0–0.2)
Basos: 1 %
EOS (ABSOLUTE): 0.2 x10E3/uL (ref 0.0–0.4)
Eos: 3 %
Hematocrit: 45.6 % (ref 37.5–51.0)
Hemoglobin: 14.8 g/dL (ref 13.0–17.7)
Immature Grans (Abs): 0 x10E3/uL (ref 0.0–0.1)
Immature Granulocytes: 0 %
Lymphocytes Absolute: 2 x10E3/uL (ref 0.7–3.1)
Lymphs: 35 %
MCH: 29.3 pg (ref 26.6–33.0)
MCHC: 32.5 g/dL (ref 31.5–35.7)
MCV: 90 fL (ref 79–97)
Monocytes Absolute: 0.6 x10E3/uL (ref 0.1–0.9)
Monocytes: 10 %
Neutrophils Absolute: 3 x10E3/uL (ref 1.4–7.0)
Neutrophils: 51 %
Platelets: 191 x10E3/uL (ref 150–450)
RBC: 5.05 x10E6/uL (ref 4.14–5.80)
RDW: 12.7 % (ref 11.6–15.4)
WBC: 5.8 x10E3/uL (ref 3.4–10.8)

## 2024-03-31 LAB — LIPID PANEL
Chol/HDL Ratio: 3.8 ratio (ref 0.0–5.0)
Cholesterol, Total: 132 mg/dL (ref 100–199)
HDL: 35 mg/dL — ABNORMAL LOW
LDL Chol Calc (NIH): 75 mg/dL (ref 0–99)
Triglycerides: 124 mg/dL (ref 0–149)
VLDL Cholesterol Cal: 22 mg/dL (ref 5–40)

## 2024-03-31 LAB — HEMOGLOBIN A1C
Est. average glucose Bld gHb Est-mCnc: 126 mg/dL
Hgb A1c MFr Bld: 6 % — ABNORMAL HIGH (ref 4.8–5.6)

## 2024-03-31 LAB — PSA: Prostate Specific Ag, Serum: 1.3 ng/mL (ref 0.0–4.0)

## 2024-04-04 ENCOUNTER — Ambulatory Visit: Admitting: Internal Medicine

## 2024-04-04 ENCOUNTER — Ambulatory Visit

## 2024-04-04 DIAGNOSIS — M62830 Muscle spasm of back: Secondary | ICD-10-CM

## 2024-04-04 DIAGNOSIS — M25532 Pain in left wrist: Secondary | ICD-10-CM

## 2024-04-04 DIAGNOSIS — M25632 Stiffness of left wrist, not elsewhere classified: Secondary | ICD-10-CM

## 2024-04-04 DIAGNOSIS — M5459 Other low back pain: Secondary | ICD-10-CM
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
## 2024-04-06 ENCOUNTER — Ambulatory Visit: Admitting: Physical Therapy

## 2024-04-06 ENCOUNTER — Other Ambulatory Visit: Payer: Self-pay | Admitting: Internal Medicine

## 2024-04-06 ENCOUNTER — Encounter: Payer: Self-pay | Admitting: Physical Therapy

## 2024-04-06 DIAGNOSIS — M25532 Pain in left wrist: Secondary | ICD-10-CM

## 2024-04-06 DIAGNOSIS — M5459 Other low back pain: Secondary | ICD-10-CM

## 2024-04-06 DIAGNOSIS — M25632 Stiffness of left wrist, not elsewhere classified: Secondary | ICD-10-CM

## 2024-04-06 DIAGNOSIS — M62830 Muscle spasm of back: Secondary | ICD-10-CM

## 2024-04-06 DIAGNOSIS — I10 Essential (primary) hypertension: Secondary | ICD-10-CM
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
## 2024-04-07 ENCOUNTER — Ambulatory Visit: Admitting: Cardiovascular Disease

## 2024-04-11 ENCOUNTER — Other Ambulatory Visit: Payer: Self-pay | Admitting: Cardiovascular Disease

## 2024-04-12 ENCOUNTER — Other Ambulatory Visit: Payer: Self-pay | Admitting: Cardiovascular Disease

## 2024-04-13 ENCOUNTER — Ambulatory Visit: Admitting: Physical Therapy
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
## 2024-04-14 ENCOUNTER — Ambulatory Visit: Payer: Self-pay | Admitting: Internal Medicine

## 2024-04-14 ENCOUNTER — Ambulatory Visit: Admitting: Internal Medicine

## 2024-04-14 DIAGNOSIS — M1A9XX0 Chronic gout, unspecified, without tophus (tophi): Secondary | ICD-10-CM

## 2024-04-14 DIAGNOSIS — E785 Hyperlipidemia, unspecified: Secondary | ICD-10-CM

## 2024-04-14 DIAGNOSIS — E119 Type 2 diabetes mellitus without complications: Secondary | ICD-10-CM

## 2024-04-14 DIAGNOSIS — I10 Essential (primary) hypertension: Secondary | ICD-10-CM

## 2024-04-14 MED ORDER — ALLOPURINOL 300 MG PO TABS: 300.0000 mg | ORAL_TABLET | Freq: Every morning | ORAL | 0 refills | Status: AC

## 2024-04-14 MED ORDER — CARVEDILOL 25 MG PO TABS: 25.0000 mg | ORAL_TABLET | Freq: Two times a day (BID) | ORAL | 0 refills | Status: AC

## 2024-04-14 MED ORDER — ATORVASTATIN CALCIUM 20 MG PO TABS: 20.0000 mg | ORAL_TABLET | Freq: Every day | ORAL | 0 refills | Status: AC

## 2024-04-14 MED ORDER — FARXIGA 10 MG PO TABS: 10.0000 mg | ORAL_TABLET | Freq: Every day | ORAL | 0 refills | Status: AC

## 2024-04-14 NOTE — Progress Notes (Signed)
 "  Established Patient Office Visit  Subjective:  Patient ID: Jason Black, male    DOB: September 09, 1965  Age: 59 y.o. MRN: [id number]  Chief Complaint  Patient presents with   Follow-up    Follow up lab results     No new complaints, here for lab review and medication refills. Labs reviewed and notable for well controlled diabetes, A1c improved and still at target, lipids at target with unremarkable cmp notable improvement in gfr. Denies any hypoglycemic episodes and home bg readings have been at target. Sprained his back and underwent lumbar injections.    No other concerns at this time.   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   Procedure: RIGHT TOTAL KNEE ARTHROPLASTY;  Surgeon: Sheril Coy, MD;  Location: [facility];  Service: Orthopedics;  Laterality: Right;    Social History   Socioeconomic History   Marital status: Married    Spouse name: Not on file   Number of children: Not on file   Years of education: Not on file   Highest education level: Not on file  Occupational History   Not on file  Tobacco Use   Smoking status: Every Day    Types: Cigarettes   Smokeless tobacco: Never  Vaping Use   Vaping status: Never Used  Substance and Sexual Activity   Alcohol use: No   Drug use: No   Sexual activity: Not on file  Other Topics Concern   Not on file  Social History Narrative   Not on file   Social Drivers of Health   Tobacco Use: High Risk (04/06/2024)   Patient History    Smoking Tobacco Use: Every Day    Smokeless Tobacco Use: Never    Passive Exposure: Not on file  Financial Resource Strain: Not on file  Food Insecurity: Not on file  Transportation Needs: Not on file  Physical Activity: Not on file  Stress: Not on file  Social Connections: Not on file  Intimate  Partner Violence: Not At Risk (05/26/2023)   Received from [facility]   HITS    Over the last 12 months how often did your partner physically hurt you?: Never    Over the last 12 months how often did your partner insult you or talk down to you?: Never    Over the last 12 months how often did your partner threaten you with physical harm?: Never    Over the last 12 months how often did your partner scream or curse at you?: Never  Depression (PHQ2-9): Not on file  Alcohol Screen: Not on file  Housing: Not on file  Utilities: Not on file  Health Literacy: Not on file    Family History  Problem Relation Age of Onset   Hypertension Mother    Hypertension Father    Hypertension Sister    Hypertension Brother     Allergies[1]  Show/hide medication list[2]  Review of Systems  Constitutional: Negative.   HENT: Negative.    Eyes: Negative.   Respiratory: Negative.    Cardiovascular: Negative.   Gastrointestinal: Negative.   Genitourinary: Negative.   Skin: Negative.   Neurological: Negative.   Endo/Heme/Allergies: Negative.        Objective:   BP 130/78   Pulse  72   Temp 97.9 F (36.6 C)   Ht 5' 10 (1.778 m)   Wt 231 lb 12.8 oz (105.1 kg)   SpO2 99%   BMI 33.26 kg/m   Vitals:   04/14/24 1520  BP: 130/78  Pulse: 72  Temp: 97.9 F (36.6 C)  Height: 5' 10 (1.778 m)  Weight: 231 lb 12.8 oz (105.1 kg)  SpO2: 99%  BMI (Calculated): 33.26    Physical Exam Vitals reviewed.  Constitutional:      Appearance: Normal appearance.  HENT:     Head: Normocephalic.     Left Ear: There is no impacted cerumen.     Nose: Nose normal.     Mouth/Throat:     Mouth: Mucous membranes are moist.     Pharynx: No posterior oropharyngeal erythema.  Eyes:     Extraocular Movements: Extraocular movements intact.     Pupils: Pupils are equal, round, and reactive to light.  Cardiovascular:     Rate and Rhythm: Regular rhythm.     Chest Wall: PMI is not displaced.     Pulses:  Normal pulses.     Heart sounds: Normal heart sounds. No murmur heard. Pulmonary:     Effort: Pulmonary effort is normal.     Breath sounds: Normal air entry. No rhonchi or rales.  Abdominal:     General: Abdomen is flat. Bowel sounds are normal. There is no distension.     Palpations: Abdomen is soft. There is no hepatomegaly, splenomegaly or mass.     Tenderness: There is no abdominal tenderness.     Hernia: A hernia is present. Hernia is present in the left inguinal area (reducible).  Musculoskeletal:        General: Normal range of motion.     Left wrist: Swelling present.     Cervical back: Normal range of motion and neck supple.     Right lower leg: No edema.     Left lower leg: No edema.     Comments: Left Wrist splinted  Skin:    General: Skin is warm and dry.  Neurological:     General: No focal deficit present.     Mental Status: He is alert and oriented to person, place, and time.     Cranial Nerves: No cranial nerve deficit.     Motor: No weakness.  Psychiatric:        Mood and Affect: Mood normal.        Behavior: Behavior normal.      No results found for any visits on 04/14/24.  Recent Results (from the past 2160 hours)  POCT CBG (Fasting - Glucose)     Status: Normal   Collection Time: 01/17/24  3:34 PM  Result Value Ref Range   Glucose Fasting, POC 90 70 - 99 mg/dL  Troponin T, High Sensitivity     Status: None   Collection Time: 03/04/24  7:58 PM  Result Value Ref Range   Troponin T High Sensitivity <15 0 - 19 ng/L    Comment: (NOTE) Biotin concentrations > 1000 ng/mL falsely decrease TnT results.  Serial cardiac troponin measurements are suggested.  Refer to the Links section for chest pain algorithms and additional  guidance. Performed at [facility], [address], [place], [place] [address]   Comprehensive metabolic panel     Status: Abnormal   Collection Time: 03/04/24  7:58 PM  Result Value Ref Range   Sodium 138 135 - 145 mmol/L  Potassium 4.5 3.5 - 5.1 mmol/L   Chloride 101 98 - 111 mmol/L   CO2 30 22 - 32 mmol/L   Glucose, Bld 100 (H) 70 - 99 mg/dL    Comment: Glucose reference range applies only to samples taken after fasting for at least 8 hours.   BUN 14 6 - 20 mg/dL   Creatinine, Ser 8.58 (H) 0.61 - 1.24 mg/dL   Calcium  9.4 8.9 - 10.3 mg/dL   Total Protein 7.1 6.5 - 8.1 g/dL   Albumin 4.2 3.5 - 5.0 g/dL   AST 30 15 - 41 U/L    Comment: HEMOLYSIS AT THIS LEVEL MAY AFFECT RESULT   ALT 25 0 - 44 U/L   Alkaline Phosphatase 69 38 - 126 U/L   Total Bilirubin 0.3 0.0 - 1.2 mg/dL   GFR, Estimated 58 (L) >60 mL/min    Comment: (NOTE) Calculated using the CKD-EPI Creatinine Equation (2021)    Anion gap 7 5 - 15    Comment: Performed at [facility], [address], [place], [place] [address]  CBC with Differential     Status: None   Collection Time: 03/04/24  7:58 PM  Result Value Ref Range   WBC 7.6 4.0 - 10.5 K/uL   RBC 4.99 4.22 - 5.81 MIL/uL   Hemoglobin 14.9 13.0 - 17.0 g/dL   HCT 55.2 60.9 - 47.9 %   MCV 89.6 80.0 - 100.0 fL   MCH 29.9 26.0 - 34.0 pg   MCHC 33.3 30.0 - 36.0 g/dL   RDW 87.0 88.4 - 84.4 %   Platelets 194 150 - 400 K/uL   nRBC 0.0 0.0 - 0.2 %   Neutrophils Relative % 56 %   Neutro Abs 4.3 1.7 - 7.7 K/uL   Lymphocytes Relative 33 %   Lymphs Abs 2.5 0.7 - 4.0 K/uL   Monocytes Relative 8 %   Monocytes Absolute 0.6 0.1 - 1.0 K/uL   Eosinophils Relative 2 %   Eosinophils Absolute 0.2 0.0 - 0.5 K/uL   Basophils Relative 1 %   Basophils Absolute 0.1 0.0 - 0.1 K/uL   Immature Granulocytes 0 %   Abs Immature Granulocytes 0.02 0.00 - 0.07 K/uL    Comment: Performed at [facility], [address], [place], [place] [address]  Resp panel by RT-PCR (RSV, Flu A&B, Covid) Anterior Nasal Swab     Status: None   Collection Time: 03/04/24  7:59 PM   Specimen: Anterior Nasal Swab  Result Value Ref Range   SARS Coronavirus 2 by RT PCR NEGATIVE NEGATIVE   Influenza A by PCR  NEGATIVE NEGATIVE   Influenza B by PCR NEGATIVE NEGATIVE    Comment: (NOTE) The Xpert Xpress SARS-CoV-2/FLU/RSV plus assay is intended as an aid in the diagnosis of influenza from Nasopharyngeal swab specimens and should not be used as a sole basis for treatment. Nasal washings and aspirates are unacceptable for Xpert Xpress SARS-CoV-2/FLU/RSV testing.  Fact Sheet for Patients: bloggercourse.com  Fact Sheet for Healthcare Providers: seriousbroker.it  This test is not yet approved or cleared by the [place]  FDA and has been authorized for detection and/or diagnosis of SARS-CoV-2 by FDA under an Emergency Use Authorization (EUA). This EUA will remain in effect (meaning this test can be used) for the duration of the COVID-19 declaration under Section 564(b)(1) of the Act, 21 U.Hodaya Curto.C. section 360bbb-3(b)(1), unless the authorization is terminated or revoked.     Resp Syncytial Virus by PCR NEGATIVE NEGATIVE  Comment: (NOTE) Fact Sheet for Patients: bloggercourse.com  Fact Sheet for Healthcare Providers: seriousbroker.it  This test is not yet approved or cleared by the [place]  FDA and has been authorized for detection and/or diagnosis of SARS-CoV-2 by FDA under an Emergency Use Authorization (EUA). This EUA will remain in effect (meaning this test can be used) for the duration of the COVID-19 declaration under Section 564(b)(1) of the Act, 21 U.Aleira Deiter.C. section 360bbb-3(b)(1), unless the authorization is terminated or revoked.  Performed at [facility], [address], [place], [place] [address]   Comprehensive metabolic panel with GFR     Status: Abnormal   Collection Time: 03/30/24  9:05 AM  Result Value Ref Range   Glucose 106 (H) 70 - 99 mg/dL   BUN 17 6 - 24 mg/dL   Creatinine, Ser 8.77 0.76 - 1.27 mg/dL   eGFR 69 >40 fO/fpw/8.26   BUN/Creatinine Ratio 14 9  - 20   Sodium 140 134 - 144 mmol/L   Potassium 4.6 3.5 - 5.2 mmol/L   Chloride 105 96 - 106 mmol/L   CO2 21 20 - 29 mmol/L   Calcium  9.5 8.7 - 10.2 mg/dL   Total Protein 7.1 6.0 - 8.5 g/dL   Albumin 4.4 3.8 - 4.9 g/dL   Globulin, Total 2.7 1.5 - 4.5 g/dL   Bilirubin Total 0.3 0.0 - 1.2 mg/dL   Alkaline Phosphatase 87 47 - 123 IU/L   AST 29 0 - 40 IU/L   ALT 32 0 - 44 IU/L  Lipid panel     Status: Abnormal   Collection Time: 03/30/24  9:05 AM  Result Value Ref Range   Cholesterol, Total 132 100 - 199 mg/dL   Triglycerides 875 0 - 149 mg/dL   HDL 35 (L) >60 mg/dL   VLDL Cholesterol Cal 22 5 - 40 mg/dL   LDL Chol Calc (NIH) 75 0 - 99 mg/dL   Chol/HDL Ratio 3.8 0.0 - 5.0 ratio    Comment:                                   T. Chol/HDL Ratio                                             Men  Women                               1/2 Avg.Risk  3.4    3.3                                   Avg.Risk  5.0    4.4                                2X Avg.Risk  9.6    7.1                                3X Avg.Risk 23.4   11.0   CBC With Diff/Platelet     Status: None   Collection Time: 03/30/24  9:05  AM  Result Value Ref Range   WBC 5.8 3.4 - 10.8 x10E3/uL   RBC 5.05 4.14 - 5.80 x10E6/uL   Hemoglobin 14.8 13.0 - 17.7 g/dL   Hematocrit 54.3 62.4 - 51.0 %   MCV 90 79 - 97 fL   MCH 29.3 26.6 - 33.0 pg   MCHC 32.5 31.5 - 35.7 g/dL   RDW 87.2 88.3 - 84.5 %   Platelets 191 150 - 450 x10E3/uL   Neutrophils 51 Not Estab. %   Lymphs 35 Not Estab. %   Monocytes 10 Not Estab. %   Eos 3 Not Estab. %   Basos 1 Not Estab. %   Neutrophils Absolute 3.0 1.4 - 7.0 x10E3/uL   Lymphocytes Absolute 2.0 0.7 - 3.1 x10E3/uL   Monocytes Absolute 0.6 0.1 - 0.9 x10E3/uL   EOS (ABSOLUTE) 0.2 0.0 - 0.4 x10E3/uL   Basophils Absolute 0.0 0.0 - 0.2 x10E3/uL   Immature Granulocytes 0 Not Estab. %   Immature Grans (Abs) 0.0 0.0 - 0.1 x10E3/uL  Hemoglobin A1c     Status: Abnormal   Collection Time: 03/30/24  9:05 AM   Result Value Ref Range   Hgb A1c MFr Bld 6.0 (H) 4.8 - 5.6 %    Comment:          Prediabetes: 5.7 - 6.4          Diabetes: >6.4          Glycemic control for adults with diabetes: <7.0    Est. average glucose Bld gHb Est-mCnc 126 mg/dL  PSA     Status: None   Collection Time: 03/30/24  9:05 AM  Result Value Ref Range   Prostate Specific Ag, Serum 1.3 0.0 - 4.0 ng/mL    Comment: Roche ECLIA methodology. According to the American Urological Association, Serum PSA should decrease and remain at undetectable levels after radical prostatectomy. The AUA defines biochemical recurrence as an initial PSA value 0.2 ng/mL or greater followed by a subsequent confirmatory PSA value 0.2 ng/mL or greater. Values obtained with different assay methods or kits cannot be used interchangeably. Results cannot be interpreted as absolute evidence of the presence or absence of malignant disease.       Assessment & Plan:  Jason Black was seen today for follow-up.  Type 2 diabetes mellitus without complication, with long-term current use of insulin (HCC) -     Farxiga ; Take 1 tablet (10 mg total) by mouth daily.  Dispense: 90 tablet; Refill: 0  Essential hypertension -     Carvedilol ; Take 1 tablet (25 mg total) by mouth 2 (two) times daily with a meal.  Dispense: 180 tablet; Refill: 0  Hyperlipidemia, unspecified hyperlipidemia type -     Atorvastatin  Calcium ; Take 1 tablet (20 mg total) by mouth at bedtime.  Dispense: 90 tablet; Refill: 0  Chronic gout without tophus, unspecified cause, unspecified site -     Allopurinol ; Take 1 tablet (300 mg total) by mouth every morning.  Dispense: 90 tablet; Refill: 0    Problem List Items Addressed This Visit       Cardiovascular and Mediastinum   Essential hypertension   Relevant Medications   carvedilol  (COREG ) 25 MG tablet   atorvastatin  (LIPITOR) 20 MG tablet     Other   Chronic gout without tophus   Relevant Medications   allopurinol  (ZYLOPRIM )  300 MG tablet   Other Visit Diagnoses       Type 2 diabetes mellitus without complication, with long-term current use of  insulin (HCC)       Relevant Medications   FARXIGA  10 MG TABS tablet   atorvastatin  (LIPITOR) 20 MG tablet     Hyperlipidemia, unspecified hyperlipidemia type       Relevant Medications   carvedilol  (COREG ) 25 MG tablet   atorvastatin  (LIPITOR) 20 MG tablet       Return in about 3 months (around 07/12/2024) for fu with labs prior.   Total time spent: 20 minutes. This time includes review of previous notes and results and patient face to face interaction during today'Jalise Zawistowski visit.    Sherrill Cinderella Perry, MD  04/14/2024   This document may have been prepared by [facility] Voice Recognition software and as such may include unintentional dictation errors.     [1] No Known Allergies [2]  Outpatient Medications Prior to Visit  Medication Sig   aspirin  EC 81 MG tablet Take 1 tablet (81 mg total) by mouth 2 (two) times daily after a meal.   clopidogrel  (PLAVIX ) 75 MG tablet Take 1 tablet by mouth once daily   furosemide  (LASIX ) 20 MG tablet Take 1 tablet (20 mg total) by mouth daily.   hydrALAZINE  (APRESOLINE ) 100 MG tablet Take 1 tablet by mouth twice daily   isosorbide  mononitrate (IMDUR ) 30 MG 24 hr tablet Take 1 tablet (30 mg total) by mouth daily.   lisinopril  (ZESTRIL ) 40 MG tablet Take 1 tablet by mouth once daily   methocarbamol  (ROBAXIN ) 500 MG tablet Take 1 tablet (500 mg total) by mouth 2 (two) times daily.   [DISCONTINUED] allopurinol  (ZYLOPRIM ) 300 MG tablet TAKE 1 TABLET BY MOUTH ONCE DAILY IN THE MORNING   [DISCONTINUED] atorvastatin  (LIPITOR) 20 MG tablet TAKE 1 TABLET BY MOUTH AT BEDTIME   [DISCONTINUED] carvedilol  (COREG ) 25 MG tablet TAKE 1 TABLET BY MOUTH TWICE DAILY WITH MEALS . APPOINTMENT REQUIRED FOR FUTURE REFILLS   [DISCONTINUED] FARXIGA  10 MG TABS tablet TAKE 1 TABLET BY MOUTH ONCE DAILY NEEDS  APPOINTMENT  FOR  FUTURE  REFILLS   lidocaine   (LIDODERM ) 5 % Place 1 patch onto the skin daily. Remove & Discard patch within 12 hours or as directed by MD (Patient not taking: Reported on 04/14/2024)   methylPREDNISolone  (MEDROL  DOSEPAK) 4 MG TBPK tablet Take as directed with food (Patient not taking: Reported on 04/14/2024)   No facility-administered medications prior to visit.   "

## 2024-04-18 ENCOUNTER — Ambulatory Visit: Admitting: Physical Therapy

## 2024-04-20 ENCOUNTER — Ambulatory Visit: Admitting: Physical Therapy

## 2024-04-25 ENCOUNTER — Ambulatory Visit: Admitting: Physical Therapy

## 2024-04-27 ENCOUNTER — Encounter

## 2024-05-02 ENCOUNTER — Ambulatory Visit: Admitting: Cardiovascular Disease

## 2024-05-02 ENCOUNTER — Ambulatory Visit: Admitting: Physical Therapy

## 2024-05-04 ENCOUNTER — Ambulatory Visit: Admitting: Physical Therapy

## 2024-05-04 ENCOUNTER — Encounter

## 2024-05-12 ENCOUNTER — Ambulatory Visit: Admitting: Cardiovascular Disease

## 2024-05-15 ENCOUNTER — Ambulatory Visit: Admitting: Orthopedic Surgery

## 2024-07-21 ENCOUNTER — Ambulatory Visit: Admitting: Internal Medicine
